# Patient Record
Sex: Female | Born: 1960
Health system: Southern US, Community
[De-identification: ages and names within clinical notes are randomized; demographics above are authoritative.]

## PROBLEM LIST (undated history)

## (undated) DIAGNOSIS — T7840XA Allergy, unspecified, initial encounter: Secondary | ICD-10-CM

## (undated) DIAGNOSIS — K219 Gastro-esophageal reflux disease without esophagitis: Secondary | ICD-10-CM

## (undated) DIAGNOSIS — I10 Essential (primary) hypertension: Secondary | ICD-10-CM

## (undated) DIAGNOSIS — F329 Major depressive disorder, single episode, unspecified: Secondary | ICD-10-CM

## (undated) DIAGNOSIS — R011 Cardiac murmur, unspecified: Secondary | ICD-10-CM

## (undated) DIAGNOSIS — I499 Cardiac arrhythmia, unspecified: Secondary | ICD-10-CM

## (undated) DIAGNOSIS — R9389 Abnormal findings on diagnostic imaging of other specified body structures: Secondary | ICD-10-CM

## (undated) DIAGNOSIS — F32A Depression, unspecified: Secondary | ICD-10-CM

## (undated) DIAGNOSIS — R079 Chest pain, unspecified: Secondary | ICD-10-CM

## (undated) DIAGNOSIS — E079 Disorder of thyroid, unspecified: Secondary | ICD-10-CM

## (undated) DIAGNOSIS — F410 Panic disorder [episodic paroxysmal anxiety] without agoraphobia: Secondary | ICD-10-CM

## (undated) DIAGNOSIS — F419 Anxiety disorder, unspecified: Secondary | ICD-10-CM

## (undated) DIAGNOSIS — Z720 Tobacco use: Secondary | ICD-10-CM

## (undated) DIAGNOSIS — E785 Hyperlipidemia, unspecified: Secondary | ICD-10-CM

## (undated) HISTORY — DX: Allergy, unspecified, initial encounter: T78.40XA

## (undated) HISTORY — PX: CARPAL TUNNEL RELEASE: SHX101

## (undated) HISTORY — DX: Gastro-esophageal reflux disease without esophagitis: K21.9

## (undated) HISTORY — DX: Depression, unspecified: F32.A

## (undated) HISTORY — DX: Essential (primary) hypertension: I10

## (undated) HISTORY — DX: Cardiac arrhythmia, unspecified: I49.9

## (undated) HISTORY — DX: Disorder of thyroid, unspecified: E07.9

## (undated) HISTORY — DX: Cardiac murmur, unspecified: R01.1

## (undated) HISTORY — DX: Major depressive disorder, single episode, unspecified: F32.9

---
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
## 1992-10-26 HISTORY — PX: HYSTEROSCOPY: SHX211

## 1993-10-26 HISTORY — PX: ABDOMINAL HYSTERECTOMY: SHX81

## 2007-10-28 LAB — HM PAP SMEAR: HM Pap smear: NEGATIVE

## 2008-04-01 ENCOUNTER — Emergency Department (HOSPITAL_COMMUNITY): Admission: EM | Admit: 2008-04-01 | Discharge: 2008-04-01 | Payer: Self-pay | Admitting: Emergency Medicine

## 2008-05-03 ENCOUNTER — Ambulatory Visit (HOSPITAL_COMMUNITY): Admission: RE | Admit: 2008-05-03 | Discharge: 2008-05-03 | Payer: Self-pay | Admitting: Obstetrics & Gynecology

## 2008-05-14 ENCOUNTER — Ambulatory Visit (HOSPITAL_COMMUNITY): Admission: RE | Admit: 2008-05-14 | Discharge: 2008-05-14 | Payer: Self-pay | Admitting: Obstetrics & Gynecology

## 2008-10-27 LAB — HM MAMMOGRAPHY: HM Mammogram: NEGATIVE

## 2009-06-13 ENCOUNTER — Ambulatory Visit (HOSPITAL_COMMUNITY): Admission: RE | Admit: 2009-06-13 | Discharge: 2009-06-13 | Payer: Self-pay | Admitting: Family Medicine

## 2009-06-24 ENCOUNTER — Encounter: Admission: RE | Admit: 2009-06-24 | Discharge: 2009-06-24 | Payer: Self-pay | Admitting: Family Medicine

## 2010-01-24 ENCOUNTER — Encounter: Admission: RE | Admit: 2010-01-24 | Discharge: 2010-01-24 | Payer: Self-pay | Admitting: Endocrinology

## 2010-11-17 ENCOUNTER — Encounter: Payer: Self-pay | Admitting: Family Medicine

## 2011-07-28 ENCOUNTER — Ambulatory Visit (INDEPENDENT_AMBULATORY_CARE_PROVIDER_SITE_OTHER): Payer: PRIVATE HEALTH INSURANCE | Admitting: Family Medicine

## 2011-07-28 ENCOUNTER — Encounter: Payer: Self-pay | Admitting: Family Medicine

## 2011-07-28 VITALS — BP 120/80 | HR 72 | Temp 97.9°F | Resp 12 | Ht 63.25 in | Wt 150.0 lb

## 2011-07-28 DIAGNOSIS — E039 Hypothyroidism, unspecified: Secondary | ICD-10-CM

## 2011-07-28 DIAGNOSIS — E785 Hyperlipidemia, unspecified: Secondary | ICD-10-CM | POA: Insufficient documentation

## 2011-07-28 DIAGNOSIS — K219 Gastro-esophageal reflux disease without esophagitis: Secondary | ICD-10-CM

## 2011-07-28 DIAGNOSIS — F17200 Nicotine dependence, unspecified, uncomplicated: Secondary | ICD-10-CM

## 2011-07-28 LAB — TSH: TSH: 0.82 u[IU]/mL (ref 0.35–5.50)

## 2011-07-28 MED ORDER — LEVOTHYROXINE SODIUM 112 MCG PO TABS: 112.0000 ug | ORAL_TABLET | Freq: Every day | ORAL | Status: DC

## 2011-07-28 NOTE — Progress Notes (Signed)
  Subjective:    Patient ID: Tara Cameron, female    DOB: 1961-02-03, 50 y.o.   MRN: [id number]  HPI New patient to establish care. Patient has history of GERD, past history depression, reported history of heart murmur, hyperlipidemia, hypothyroidism and ongoing nicotine use. She's had previous surgeries as indicated elsewhere. Also reported history of endometriosis. She takes thyroid replacement and omeprazole 20 mg daily. No labs in approximately one year. Allergy to sulfa, erythromycin, and Zithromax.  Mother with breast cancer history. Father had lung cancer.  Patient is recently remarried. This is second marriage. Smokes about half-pack cigarettes per day. Occasional alcohol use.  Recently had health screening at work. Blood pressure 178/78. No confirmed history of hypertension. No headaches or dizziness. She's not had complete physical in some time. Had remote flexible sigmoidoscopy about 10 years ago. Tetanus about 5 years ago.   Review of Systems  Constitutional: Negative for fever, chills, activity change, appetite change, fatigue and unexpected weight change.  HENT: Negative for ear pain.   Respiratory: Negative for cough and shortness of breath.   Cardiovascular: Negative for chest pain, palpitations and leg swelling.  Gastrointestinal: Negative for nausea, vomiting, abdominal pain, diarrhea and blood in stool.  Genitourinary: Negative for dysuria.  Skin: Negative for rash.  Neurological: Negative for dizziness, weakness and headaches.  Hematological: Negative for adenopathy. Does not bruise/bleed easily.  Psychiatric/Behavioral: Negative for dysphoric mood and agitation.       Objective:   Physical Exam  Constitutional: She is oriented to person, place, and time. She appears well-developed and well-nourished.  HENT:  Right Ear: External ear normal.  Left Ear: External ear normal.  Mouth/Throat: Oropharynx is clear and moist.  Neck: Neck supple. No thyromegaly  present.  Cardiovascular: Normal rate and regular rhythm.   No murmur heard. Pulmonary/Chest: Effort normal and breath sounds normal. No respiratory distress. She has no wheezes. She has no rales.  Musculoskeletal: She exhibits no edema.  Lymphadenopathy:    She has no cervical adenopathy.  Neurological: She is alert and oriented to person, place, and time.  Skin: No rash noted.  Psychiatric: She has a normal mood and affect. Her behavior is normal.          Assessment & Plan:  #1 hypothyroidism. Recheck TSH #2 history of GERD. Relatively stable. Continue omeprazole  #3 nicotine use. Discussed smoking cessation strategies. Patient reluctant to consider Chantix with prior history of depression  #4 past history depression currently stable off medication  #5 dyslipidemia. Recent labs reviewed. Dietary factors discussed. Consider complete physical later this year repeat lipids then.

## 2011-07-28 NOTE — Patient Instructions (Signed)
Consider complete physical later this year.

## 2011-07-29 NOTE — Progress Notes (Signed)
Quick Note:  Pt informed on home VM ______ 

## 2011-09-04 ENCOUNTER — Encounter: Payer: Self-pay | Admitting: Family Medicine

## 2011-09-04 ENCOUNTER — Ambulatory Visit (INDEPENDENT_AMBULATORY_CARE_PROVIDER_SITE_OTHER): Payer: PRIVATE HEALTH INSURANCE | Admitting: Family Medicine

## 2011-09-04 VITALS — BP 130/96 | Temp 98.3°F | Wt 153.0 lb

## 2011-09-04 DIAGNOSIS — F411 Generalized anxiety disorder: Secondary | ICD-10-CM

## 2011-09-04 DIAGNOSIS — F419 Anxiety disorder, unspecified: Secondary | ICD-10-CM

## 2011-09-04 DIAGNOSIS — G4700 Insomnia, unspecified: Secondary | ICD-10-CM

## 2011-09-04 MED ORDER — CLONAZEPAM 0.5 MG PO TABS: 0.5000 mg | ORAL_TABLET | Freq: Two times a day (BID) | ORAL | Status: DC | PRN
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
## 2011-09-10 ENCOUNTER — Telehealth: Payer: Self-pay | Admitting: Family Medicine

## 2011-09-10 NOTE — Telephone Encounter (Signed)
Pt called and said that she faxed over some FMLA paperwork that Dr Caryl Never needs to complete and sign, because he had written pt out of work for 2 wks re: ?ptsd. Pt said to be sure to fax form back to secured fax # [phone] attn [facility] @ [place]. Pt is aware that Dr Caryl Never is out of the office this wk.

## 2011-09-15 ENCOUNTER — Ambulatory Visit (INDEPENDENT_AMBULATORY_CARE_PROVIDER_SITE_OTHER): Payer: PRIVATE HEALTH INSURANCE | Admitting: Licensed Clinical Social Worker

## 2011-09-15 DIAGNOSIS — F4310 Post-traumatic stress disorder, unspecified: Secondary | ICD-10-CM

## 2011-09-15 DIAGNOSIS — F411 Generalized anxiety disorder: Secondary | ICD-10-CM

## 2011-09-15 DIAGNOSIS — F331 Major depressive disorder, recurrent, moderate: Secondary | ICD-10-CM

## 2011-09-15 NOTE — Telephone Encounter (Signed)
Pt would like nancy to return her call concerning FMLA . Pt has ov with MD tomorrow

## 2011-09-16 ENCOUNTER — Telehealth: Payer: Self-pay | Admitting: Family Medicine

## 2011-09-16 ENCOUNTER — Ambulatory Visit (INDEPENDENT_AMBULATORY_CARE_PROVIDER_SITE_OTHER): Payer: PRIVATE HEALTH INSURANCE | Admitting: Family Medicine

## 2011-09-16 ENCOUNTER — Encounter: Payer: Self-pay | Admitting: Family Medicine

## 2011-09-16 VITALS — BP 102/80 | Temp 98.1°F | Wt 154.0 lb

## 2011-09-16 DIAGNOSIS — F411 Generalized anxiety disorder: Secondary | ICD-10-CM

## 2011-09-16 DIAGNOSIS — F419 Anxiety disorder, unspecified: Secondary | ICD-10-CM

## 2011-09-16 MED ORDER — CLONAZEPAM 1 MG PO TABS: 1.0000 mg | ORAL_TABLET | Freq: Two times a day (BID) | ORAL | Status: DC | PRN

## 2011-09-16 NOTE — Telephone Encounter (Signed)
Pt was told to call and let Burchette know that Dr Loralie Champagne appt is 10-21-2011. Should she follow up with Dr Caryl Never prior? Thanks.

## 2011-09-16 NOTE — Telephone Encounter (Signed)
No need to unless she feels symptoms worsen between now and then.

## 2011-09-16 NOTE — Telephone Encounter (Signed)
Pt seen today.  Papers to be completed today.
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
## 2011-09-18 ENCOUNTER — Ambulatory Visit: Payer: PRIVATE HEALTH INSURANCE | Admitting: Family Medicine

## 2011-09-18 NOTE — Telephone Encounter (Signed)
FMLA papers filled out to reflect time out until f/u with psychiatry.

## 2011-09-18 NOTE — Telephone Encounter (Signed)
Pt is aware.  

## 2011-09-18 NOTE — Telephone Encounter (Signed)
Left a message for pt to return call 

## 2011-09-18 NOTE — Telephone Encounter (Signed)
FMLA papers have been faxed.  

## 2011-09-18 NOTE — Telephone Encounter (Signed)
Pt states she is suppose to be written out of work until the middle of December. December 26,2012 was the earliest appointment pt could get with Dr Nolen Mu.  Pt states she does not feel comfortable going back to work with being on a mood stabilizer. Pls advise.

## 2011-09-23 ENCOUNTER — Ambulatory Visit: Payer: PRIVATE HEALTH INSURANCE | Admitting: Family Medicine

## 2011-09-24 ENCOUNTER — Ambulatory Visit (INDEPENDENT_AMBULATORY_CARE_PROVIDER_SITE_OTHER): Payer: PRIVATE HEALTH INSURANCE | Admitting: Licensed Clinical Social Worker

## 2011-09-24 DIAGNOSIS — F331 Major depressive disorder, recurrent, moderate: Secondary | ICD-10-CM

## 2011-09-24 DIAGNOSIS — F411 Generalized anxiety disorder: Secondary | ICD-10-CM

## 2011-09-24 DIAGNOSIS — F4310 Post-traumatic stress disorder, unspecified: Secondary | ICD-10-CM

## 2011-09-29 ENCOUNTER — Telehealth: Payer: Self-pay | Admitting: Family Medicine

## 2011-09-29 NOTE — Telephone Encounter (Signed)
Pt called and wanted to let you know that Judithe Modest has recommended that pt see Dr. Lanier Clam psychiatrist,  and pt is sch at 10/05/11 at 11am to see nurse practitioner, Valinda Hoar.

## 2011-10-08 ENCOUNTER — Ambulatory Visit (INDEPENDENT_AMBULATORY_CARE_PROVIDER_SITE_OTHER): Payer: PRIVATE HEALTH INSURANCE | Admitting: Licensed Clinical Social Worker

## 2011-10-08 DIAGNOSIS — F4310 Post-traumatic stress disorder, unspecified: Secondary | ICD-10-CM

## 2011-10-08 DIAGNOSIS — F331 Major depressive disorder, recurrent, moderate: Secondary | ICD-10-CM

## 2011-10-08 DIAGNOSIS — F411 Generalized anxiety disorder: Secondary | ICD-10-CM

## 2011-10-08 NOTE — Telephone Encounter (Signed)
FYI

## 2011-10-14 ENCOUNTER — Ambulatory Visit: Payer: PRIVATE HEALTH INSURANCE | Admitting: Licensed Clinical Social Worker

## 2011-10-16 ENCOUNTER — Ambulatory Visit: Payer: PRIVATE HEALTH INSURANCE | Admitting: Licensed Clinical Social Worker

## 2012-04-13 ENCOUNTER — Other Ambulatory Visit (INDEPENDENT_AMBULATORY_CARE_PROVIDER_SITE_OTHER): Payer: PRIVATE HEALTH INSURANCE

## 2012-04-13 DIAGNOSIS — Z0000 Encounter for general adult medical examination without abnormal findings: Secondary | ICD-10-CM

## 2012-04-13 LAB — HEPATIC FUNCTION PANEL
ALT: 17 U/L (ref 0–35)
AST: 23 U/L (ref 0–37)
Albumin: 4.3 g/dL (ref 3.5–5.2)
Alkaline Phosphatase: 73 U/L (ref 39–117)
Bilirubin, Direct: 0 mg/dL (ref 0.0–0.3)
Total Bilirubin: 0.5 mg/dL (ref 0.3–1.2)
Total Protein: 7.6 g/dL (ref 6.0–8.3)

## 2012-04-13 LAB — CBC WITH DIFFERENTIAL/PLATELET
Basophils Absolute: 0 10*3/uL (ref 0.0–0.1)
Basophils Relative: 0.3 % (ref 0.0–3.0)
Eosinophils Relative: 3.1 % (ref 0.0–5.0)
Lymphocytes Relative: 31.5 % (ref 12.0–46.0)
MCHC: 33.4 g/dL (ref 30.0–36.0)
MCV: 96.7 fl (ref 78.0–100.0)
Neutro Abs: 4 10*3/uL (ref 1.4–7.7)
Neutrophils Relative %: 56.7 % (ref 43.0–77.0)

## 2012-04-13 LAB — TSH: TSH: 1.16 u[IU]/mL (ref 0.35–5.50)

## 2012-04-13 LAB — LIPID PANEL
Cholesterol: 252 mg/dL — ABNORMAL HIGH (ref 0–200)
HDL: 57.3 mg/dL
Total CHOL/HDL Ratio: 4
Triglycerides: 109 mg/dL (ref 0.0–149.0)
VLDL: 21.8 mg/dL (ref 0.0–40.0)

## 2012-04-13 LAB — BASIC METABOLIC PANEL WITH GFR
BUN: 13 mg/dL (ref 6–23)
CO2: 28 meq/L (ref 19–32)
Calcium: 9 mg/dL (ref 8.4–10.5)
Chloride: 106 meq/L (ref 96–112)
Creatinine, Ser: 0.7 mg/dL (ref 0.4–1.2)
GFR: 90.92 mL/min
Glucose, Bld: 83 mg/dL (ref 70–99)
Potassium: 3.8 meq/L (ref 3.5–5.1)
Sodium: 140 meq/L (ref 135–145)

## 2012-04-13 LAB — POCT URINALYSIS DIPSTICK
Bilirubin, UA: NEGATIVE
Blood, UA: NEGATIVE
Glucose, UA: NEGATIVE
Ketones, UA: NEGATIVE
Leukocytes, UA: NEGATIVE
Nitrite, UA: NEGATIVE
Protein, UA: NEGATIVE
Spec Grav, UA: 1.01
Urobilinogen, UA: 0.2
pH, UA: 6.5

## 2012-04-18 ENCOUNTER — Ambulatory Visit (INDEPENDENT_AMBULATORY_CARE_PROVIDER_SITE_OTHER): Payer: PRIVATE HEALTH INSURANCE | Admitting: Family Medicine

## 2012-04-18 ENCOUNTER — Encounter: Payer: Self-pay | Admitting: Family Medicine

## 2012-04-18 VITALS — BP 122/80 | HR 72 | Temp 98.1°F | Resp 12 | Ht 64.0 in | Wt 156.0 lb

## 2012-04-18 DIAGNOSIS — B351 Tinea unguium: Secondary | ICD-10-CM

## 2012-04-18 DIAGNOSIS — Z0000 Encounter for general adult medical examination without abnormal findings: Secondary | ICD-10-CM

## 2012-04-18 DIAGNOSIS — Z23 Encounter for immunization: Secondary | ICD-10-CM

## 2012-04-18 DIAGNOSIS — Z8742 Personal history of other diseases of the female genital tract: Secondary | ICD-10-CM

## 2012-04-18 DIAGNOSIS — E785 Hyperlipidemia, unspecified: Secondary | ICD-10-CM

## 2012-04-18 MED ORDER — LEVOTHYROXINE SODIUM 112 MCG PO TABS: 112.0000 ug | ORAL_TABLET | Freq: Every day | ORAL | Status: DC

## 2012-04-18 MED ORDER — TERBINAFINE HCL 250 MG PO TABS: 250.0000 mg | ORAL_TABLET | Freq: Every day | ORAL | Status: DC

## 2012-04-18 NOTE — Patient Instructions (Addendum)
Cholesterol Control Diet Cholesterol levels in your body are determined significantly by your diet. Cholesterol levels may also be related to heart disease. The following material helps to explain this relationship and discusses what you can do to help keep your heart healthy. Not all cholesterol is bad. Low-density lipoprotein (LDL) cholesterol is the "bad" cholesterol. It may cause fatty deposits to build up inside your arteries. High-density lipoprotein (HDL) cholesterol is "good." It helps to remove the "bad" LDL cholesterol from your blood. Cholesterol is a very important risk factor for heart disease. Other risk factors are high blood pressure, smoking, stress, heredity, and weight. The heart muscle gets its supply of blood through the coronary arteries. If your LDL cholesterol is high and your HDL cholesterol is low, you are at risk for having fatty deposits build up in your coronary arteries. This leaves less room through which blood can flow. Without sufficient blood and oxygen, the heart muscle cannot function properly and you may feel chest pains (angina pectoris). When a coronary artery closes up entirely, a part of the heart muscle may die, causing a heart attack (myocardial infarction). CHECKING CHOLESTEROL When your caregiver sends your blood to a lab to be analyzed for cholesterol, a complete lipid (fat) profile may be done. With this test, the total amount of cholesterol and levels of LDL and HDL are determined. Triglycerides are a type of fat that circulates in the blood and can also be used to determine heart disease risk. The list below describes what the numbers should be: Test: Total Cholesterol. Less than 200 mg/dl.  Test: LDL "bad cholesterol." Less than 100 mg/dl.  Less than 70 mg/dl if you are at very high risk of a heart attack or sudden cardiac death.  Test: HDL "good cholesterol." Greater than 50 mg/dl for women.  Greater than 40 mg/dl for men.  Test: Triglycerides. Less  than 150 mg/dl.  CONTROLLING CHOLESTEROL WITH DIET Although exercise and lifestyle factors are important, your diet is key. That is because certain foods are known to raise cholesterol and others to lower it. The goal is to balance foods for their effect on cholesterol and more importantly, to replace saturated and trans fat with other types of fat, such as monounsaturated fat, polyunsaturated fat, and omega-3 fatty acids. On average, a person should consume no more than 15 to 17 g of saturated fat daily. Saturated and trans fats are considered "bad" fats, and they will raise LDL cholesterol. Saturated fats are primarily found in animal products such as meats, butter, and cream. However, that does not mean you need to sacrifice all your favorite foods. Today, there are good tasting, low-fat, low-cholesterol substitutes for most of the things you like to eat. Choose low-fat or nonfat alternatives. Choose round or loin cuts of red meat, since these types of cuts are lowest in fat and cholesterol. Chicken (without the skin), fish, veal, and ground [place] breast are excellent choices. Eliminate fatty meats, such as hot dogs and salami. Even shellfish have little or no saturated fat. Have a 3 oz (85 g) portion when you eat lean meat, poultry, or fish. Trans fats are also called "partially hydrogenated oils." They are oils that have been scientifically manipulated so that they are solid at room temperature resulting in a longer shelf life and improved taste and texture of foods in which they are added. Trans fats are found in stick margarine, some tub margarines, cookies, crackers, and baked goods.  When baking and cooking, oils are an  excellent substitute for butter. The monounsaturated oils are especially beneficial since it is believed they lower LDL and raise HDL. The oils you should avoid entirely are saturated tropical oils, such as coconut and palm.  Remember to eat liberally from food groups that are  naturally free of saturated and trans fat, including fish, fruit, vegetables, beans, grains (barley, rice, couscous, bulgur wheat), and pasta (without cream sauces).  IDENTIFYING FOODS THAT LOWER CHOLESTEROL  Soluble fiber may lower your cholesterol. This type of fiber is found in fruits such as apples, vegetables such as broccoli, potatoes, and carrots, legumes such as beans, peas, and lentils, and grains such as barley. Foods fortified with plant sterols (phytosterol) may also lower cholesterol. You should eat at least 2 g per day of these foods for a cholesterol lowering effect.  Read package labels to identify low-saturated fats, trans fats free, and low-fat foods at the supermarket. Select cheeses that have only 2 to 3 g saturated fat per ounce. Use a heart-healthy tub margarine that is free of trans fats or partially hydrogenated oil. When buying baked goods (cookies, crackers), avoid partially hydrogenated oils. Breads and muffins should be made from whole grains (whole-wheat or whole oat flour, instead of "flour" or "enriched flour"). Buy non-creamy canned soups with reduced salt and no added fats.  FOOD PREPARATION TECHNIQUES  Never deep-fry. If you must fry, either stir-fry, which uses very little fat, or use non-stick cooking sprays. When possible, broil, bake, or roast meats, and steam vegetables. Instead of dressing vegetables with butter or margarine, use lemon and herbs, applesauce and cinnamon (for squash and sweet potatoes), nonfat yogurt, salsa, and low-fat dressings for salads.  LOW-SATURATED FAT / LOW-FAT FOOD SUBSTITUTES Meats / Saturated Fat (g) Avoid: Steak, marbled (3 oz/85 g) / 11 g  Choose: Steak, lean (3 oz/85 g) / 4 g  Avoid: Hamburger (3 oz/85 g) / 7 g  Choose: Hamburger, lean (3 oz/85 g) / 5 g  Avoid: Ham (3 oz/85 g) / 6 g  Choose: Ham, lean cut (3 oz/85 g) / 2.4 g  Avoid: Chicken, with skin, dark meat (3 oz/85 g) / 4 g  Choose: Chicken, skin removed, dark meat (3 oz/85  g) / 2 g  Avoid: Chicken, with skin, light meat (3 oz/85 g) / 2.5 g  Choose: Chicken, skin removed, light meat (3 oz/85 g) / 1 g  Dairy / Saturated Fat (g) Avoid: Whole milk (1 cup) / 5 g  Choose: Low-fat milk, 2% (1 cup) / 3 g  Choose: Low-fat milk, 1% (1 cup) / 1.5 g  Choose: Skim milk (1 cup) / 0.3 g  Avoid: Hard cheese (1 oz/28 g) / 6 g  Choose: Skim milk cheese (1 oz/28 g) / 2 to 3 g  Avoid: Cottage cheese, 4% fat (1 cup) / 6.5 g  Choose: Low-fat cottage cheese, 1% fat (1 cup) / 1.5 g  Avoid: Ice cream (1 cup) / 9 g  Choose: Sherbet (1 cup) / 2.5 g  Choose: Nonfat frozen yogurt (1 cup) / 0.3 g  Choose: Frozen fruit bar / trace  Avoid: Whipped cream (1 tbs) / 3.5 g  Choose: Nondairy whipped topping (1 tbs) / 1 g  Condiments / Saturated Fat (g) Avoid: Mayonnaise (1 tbs) / 2 g  Choose: Low-fat mayonnaise (1 tbs) / 1 g  Avoid: Butter (1 tbs) / 7 g  Choose: Extra light margarine (1 tbs) / 1 g  Avoid: Coconut oil (1 tbs) / 11.8 g  Choose: Olive oil (1 tbs) / 1.8 g  Choose: Corn oil (1 tbs) / 1.7 g  Choose: Safflower oil (1 tbs) / 1.2 g  Choose: Sunflower oil (1 tbs) / 1.4 g  Choose: Soybean oil (1 tbs) / 2.4 g  Choose: Canola oil (1 tbs) / 1 g  Document Released: 10/12/2005 Document Revised: 10/01/2011 Document Reviewed: 04/02/2011 [facility] Patient Information 2012 [place], [place].  Smoking Cessation This document explains the best ways for you to quit smoking and new treatments to help. It lists new medicines that can double or triple your chances of quitting and quitting for good. It also considers ways to avoid relapses and concerns you may have about quitting, including weight gain. NICOTINE: A POWERFUL ADDICTION If you have tried to quit smoking, you know how hard it can be. It is hard because nicotine is a very addictive drug. For some people, it can be as addictive as heroin or cocaine. Usually, people make 2 or 3 tries, or more, before finally being able to quit. Each time  you try to quit, you can learn about what helps and what hurts. Quitting takes hard work and a lot of effort, but you can quit smoking. QUITTING SMOKING IS ONE OF THE MOST IMPORTANT THINGS YOU WILL EVER DO.  You will live longer, feel better, and live better.   The impact on your body of quitting smoking is felt almost immediately:   Within 20 minutes, blood pressure decreases. Pulse returns to its normal level.   After 8 hours, carbon monoxide levels in the blood return to normal. Oxygen level increases.   After 24 hours, chance of heart attack starts to decrease. Breath, hair, and body stop smelling like smoke.   After 48 hours, damaged nerve endings begin to recover. Sense of taste and smell improve.   After 72 hours, the body is virtually free of nicotine. Bronchial tubes relax and breathing becomes easier.   After 2 to 12 weeks, lungs can hold more air. Exercise becomes easier and circulation improves.   Quitting will reduce your risk of having a heart attack, stroke, cancer, or lung disease:   After 1 year, the risk of coronary heart disease is cut in half.   After 5 years, the risk of stroke falls to the same as a nonsmoker.   After 10 years, the risk of lung cancer is cut in half and the risk of other cancers decreases significantly.   After 15 years, the risk of coronary heart disease drops, usually to the level of a nonsmoker.   If you are pregnant, quitting smoking will improve your chances of having a healthy baby.   The people you live with, especially your children, will be healthier.   You will have extra money to spend on things other than cigarettes.  FIVE KEYS TO QUITTING Studies have shown that these 5 steps will help you quit smoking and quit for good. You have the best chances of quitting if you use them together: 1. Get ready.  2. Get support and encouragement.  3. Learn new skills and behaviors.  4. Get medicine to reduce your nicotine addiction and use  it correctly.  5. Be prepared for relapse or difficult situations. Be determined to continue trying to quit, even if you do not succeed at first.  1. GET READY  Set a quit date.   Change your environment.   Get rid of ALL cigarettes, ashtrays, matches, and lighters in your home, car, and place  of work.   Do not let people smoke in your home.   Review your past attempts to quit. Think about what worked and what did not.   Once you quit, do not smoke. NOT EVEN A PUFF!  2. GET SUPPORT AND ENCOURAGEMENT Studies have shown that you have a better chance of being successful if you have help. You can get support in many ways.  Tell your family, friends, and coworkers that you are going to quit and need their support. Ask them not to smoke around you.   Talk to your caregivers (doctor, dentist, nurse, pharmacist, psychologist, and/or smoking counselor).   Get individual, group, or telephone counseling and support. The more counseling you have, the better your chances are of quitting. Programs are available at [employer] and health centers. Call your local health department for information about programs in your area.   Spiritual beliefs and practices may help some smokers quit.   Quit meters are Photographer that keep track of quit statistics, such as amount of "quit-time," cigarettes not smoked, and money saved.   Many smokers find one or more of the many self-help books available useful in helping them quit and stay off tobacco.  3. LEARN NEW SKILLS AND BEHAVIORS  Try to distract yourself from urges to smoke. Talk to someone, go for a walk, or occupy your time with a task.   When you first try to quit, change your routine. Take a different route to work. Drink tea instead of coffee. Eat breakfast in a different place.   Do something to reduce your stress. Take a hot bath, exercise, or read a book.   Plan something enjoyable to do every day. Reward  yourself for not smoking.   Explore interactive web-based programs that specialize in helping you quit.  4. GET MEDICINE AND USE IT CORRECTLY Medicines can help you stop smoking and decrease the urge to smoke. Combining medicine with the above behavioral methods and support can quadruple your chances of successfully quitting smoking. The [place] Food and Drug Administration (FDA) has approved 7 medicines to help you quit smoking. These medicines fall into 3 categories.  Nicotine replacement therapy (delivers nicotine to your body without the negative effects and risks of smoking):   Nicotine gum: Available over-the-counter.   Nicotine lozenges: Available over-the-counter.   Nicotine inhaler: Available by prescription.   Nicotine nasal spray: Available by prescription.   Nicotine skin patches (transdermal): Available by prescription and over-the-counter.   Antidepressant medicine (helps people abstain from smoking, but how this works is unknown):   Bupropion sustained-release (SR) tablets: Available by prescription.   Nicotinic receptor partial agonist (simulates the effect of nicotine in your brain):   Varenicline tartrate tablets: Available by prescription.   Ask your caregiver for advice about which medicines to use and how to use them. Carefully read the information on the package.   Everyone who is trying to quit may benefit from using a medicine. If you are pregnant or trying to become pregnant, nursing an infant, you are under age 56, or you smoke fewer than 10 cigarettes per day, talk to your caregiver before taking any nicotine replacement medicines.   You should stop using a nicotine replacement product and call your caregiver if you experience nausea, dizziness, weakness, vomiting, fast or irregular heartbeat, mouth problems with the lozenge or gum, or redness or swelling of the skin around the patch that does not go away.   Do not use  any other product containing nicotine  while using a nicotine replacement product.   Talk to your caregiver before using these products if you have diabetes, heart disease, asthma, stomach ulcers, you had a recent heart attack, you have high blood pressure that is not controlled with medicine, a history of irregular heartbeat, or you have been prescribed medicine to help you quit smoking.  5. BE PREPARED FOR RELAPSE OR DIFFICULT SITUATIONS  Most relapses occur within the first 3 months after quitting. Do not be discouraged if you start smoking again. Remember, most people try several times before they finally quit.   You may have symptoms of withdrawal because your body is used to nicotine. You may crave cigarettes, be irritable, feel very hungry, cough often, get headaches, or have difficulty concentrating.   The withdrawal symptoms are only temporary. They are strongest when you first quit, but they will go away within 10 to 14 days.  Here are some difficult situations to watch for:  Alcohol. Avoid drinking alcohol. Drinking lowers your chances of successfully quitting.   Caffeine. Try to reduce the amount of caffeine you consume. It also lowers your chances of successfully quitting.   Other smokers. Being around smoking can make you want to smoke. Avoid smokers.   Weight gain. Many smokers will gain weight when they quit, usually less than 10 pounds. Eat a healthy diet and stay active. Do not let weight gain distract you from your main goal, quitting smoking. Some medicines that help you quit smoking may also help delay weight gain. You can always lose the weight gained after you quit.   Bad mood or depression. There are a lot of ways to improve your mood other than smoking.  If you are having problems with any of these situations, talk to your caregiver. SPECIAL SITUATIONS AND CONDITIONS Studies suggest that everyone can quit smoking. Your situation or condition can give you a special reason to quit.  Pregnant women/new  mothers: By quitting, you protect your baby's health and your own.   Hospitalized patients: By quitting, you reduce health problems and help healing.   Heart attack patients: By quitting, you reduce your risk of a second heart attack.   Lung, head, and neck cancer patients: By quitting, you reduce your chance of a second cancer.   Parents of children and adolescents: By quitting, you protect your children from illnesses caused by secondhand smoke.  QUESTIONS TO THINK ABOUT Think about the following questions before you try to stop smoking. You may want to talk about your answers with your caregiver.  Why do you want to quit?   If you tried to quit in the past, what helped and what did not?   What will be the most difficult situations for you after you quit? How will you plan to handle them?   Who can help you through the tough times? Your family? Friends? Caregiver?   What pleasures do you get from smoking? What ways can you still get pleasure if you quit?  Here are some questions to ask your caregiver:  How can you help me to be successful at quitting?   What medicine do you think would be best for me and how should I take it?   What should I do if I need more help?   What is smoking withdrawal like? How can I get information on withdrawal?  Quitting takes hard work and a lot of effort, but you can quit smoking. FOR MORE INFORMATION  Smokefree.gov (http://www.davis-sullivan.com/) provides free, accurate, evidence-based information and professional assistance to help support the immediate and long-term needs of people trying to quit smoking. Document Released: 10/06/2001 Document Revised: 10/01/2011 Document Reviewed: 07/29/2009 [facility] Patient Information 2012 [place], [place].

## 2012-04-18 NOTE — Progress Notes (Signed)
Subjective:    Patient ID: Tara Cameron, female    DOB: Dec 28, 1960, 51 y.o.   MRN: [id number]  HPI  Complete physical. Patient history of dyslipidemia, ongoing nicotine use, hypothyroidism, and GERD. Still smoking about half-pack cigarettes per day. She wants to quit. She has reservations about trying Chantix and Wellbutrin.  Hypothyroidism treated with levothyroxine. Compliant with therapy. She's had ongoing symptoms of reflux when she tries to stop omeprazole. No mammogram in 3 years. Previous hysterectomy for benign disease and no indication for Pap smear. Last tetanus greater than 10 years ago. No history of Pneumovax.  Past Medical History  Diagnosis Date  . Depression   . GERD (gastroesophageal reflux disease)   . Allergy   . Arrhythmia   . Cardiac murmur   . Hypertension   . Chronic kidney disease   . Thyroid disease    Past Surgical History  Procedure Date  . Tonsillectomy 1969  . Abdominal hysterectomy 1995  . Hysteroscopy 1994  . Carpal tunnel release     1988, 1994    reports that she has been smoking Cigarettes.  She has a 2.5 pack-year smoking history. She does not have any smokeless tobacco history on file. Her alcohol and drug histories not on file. family history includes Cancer in her father and mother. Allergies  Allergen Reactions  . Erythromycin     hives  . Iodine     hives  . Sulfa Antibiotics     hives  . Zithromax (Azithromycin Dihydrate)     hives      Review of Systems  Constitutional: Negative for fever, activity change, appetite change, fatigue and unexpected weight change.  HENT: Negative for hearing loss, ear pain, sore throat and trouble swallowing.   Eyes: Negative for visual disturbance.  Respiratory: Negative for cough and shortness of breath.   Cardiovascular: Negative for chest pain and palpitations.  Gastrointestinal: Negative for abdominal pain, diarrhea, constipation and blood in stool.  Genitourinary: Negative for  dysuria and hematuria.  Musculoskeletal: Negative for myalgias, back pain and arthralgias.  Skin: Negative for rash.  Neurological: Negative for dizziness, syncope and headaches.  Hematological: Negative for adenopathy.  Psychiatric/Behavioral: Negative for confusion and dysphoric mood.       Objective:   Physical Exam  Constitutional: She is oriented to person, place, and time. She appears well-developed and well-nourished.  HENT:  Head: Normocephalic and atraumatic.  Eyes: EOM are normal. Pupils are equal, round, and reactive to light.  Neck: Normal range of motion. Neck supple. No thyromegaly present.  Cardiovascular: Normal rate, regular rhythm and normal heart sounds.   No murmur heard. Pulmonary/Chest: Breath sounds normal. No respiratory distress. She has no wheezes. She has no rales.  Abdominal: Soft. Bowel sounds are normal. She exhibits no distension and no mass. There is no tenderness. There is no rebound and no guarding.  Musculoskeletal: Normal range of motion. She exhibits no edema.  Lymphadenopathy:    She has no cervical adenopathy.  Neurological: She is alert and oriented to person, place, and time. She displays normal reflexes. No cranial nerve deficit.  Skin: No rash noted.  Psychiatric: She has a normal mood and affect. Her behavior is normal. Judgment and thought content normal.          Assessment & Plan:  Complete physical. Hemoccults given. Patient is not sure she wishes to schedule colonoscopy yet. Pneumovax given. Tetanus given. Refill levothyroxin for one year. Set up repeat mammogram. Smoking cessation discussed. Repeat lipid 6  months after reduction in saturated fats.

## 2012-04-29 ENCOUNTER — Ambulatory Visit: Payer: PRIVATE HEALTH INSURANCE | Admitting: Family Medicine

## 2012-05-04 ENCOUNTER — Emergency Department (HOSPITAL_COMMUNITY): Payer: 59

## 2012-05-04 ENCOUNTER — Encounter (HOSPITAL_COMMUNITY): Payer: Self-pay | Admitting: *Deleted

## 2012-05-04 ENCOUNTER — Observation Stay (HOSPITAL_COMMUNITY)
Admission: EM | Admit: 2012-05-04 | Discharge: 2012-05-05 | Disposition: A | Discharge status: Home / Self Care | Payer: 59 | Attending: Internal Medicine | Admitting: Internal Medicine

## 2012-05-04 ENCOUNTER — Observation Stay (HOSPITAL_COMMUNITY): Payer: 59

## 2012-05-04 ENCOUNTER — Telehealth: Payer: Self-pay | Admitting: Family Medicine

## 2012-05-04 DIAGNOSIS — R079 Chest pain, unspecified: Secondary | ICD-10-CM

## 2012-05-04 DIAGNOSIS — I200 Unstable angina: Secondary | ICD-10-CM

## 2012-05-04 DIAGNOSIS — E039 Hypothyroidism, unspecified: Secondary | ICD-10-CM | POA: Diagnosis present

## 2012-05-04 DIAGNOSIS — F329 Major depressive disorder, single episode, unspecified: Secondary | ICD-10-CM | POA: Insufficient documentation

## 2012-05-04 DIAGNOSIS — I10 Essential (primary) hypertension: Secondary | ICD-10-CM

## 2012-05-04 DIAGNOSIS — F17200 Nicotine dependence, unspecified, uncomplicated: Secondary | ICD-10-CM | POA: Insufficient documentation

## 2012-05-04 DIAGNOSIS — F419 Anxiety disorder, unspecified: Secondary | ICD-10-CM

## 2012-05-04 DIAGNOSIS — R0789 Other chest pain: Principal | ICD-10-CM | POA: Insufficient documentation

## 2012-05-04 DIAGNOSIS — Z79899 Other long term (current) drug therapy: Secondary | ICD-10-CM | POA: Insufficient documentation

## 2012-05-04 DIAGNOSIS — E876 Hypokalemia: Secondary | ICD-10-CM

## 2012-05-04 DIAGNOSIS — F410 Panic disorder [episodic paroxysmal anxiety] without agoraphobia: Secondary | ICD-10-CM | POA: Insufficient documentation

## 2012-05-04 DIAGNOSIS — Z720 Tobacco use: Secondary | ICD-10-CM

## 2012-05-04 DIAGNOSIS — K219 Gastro-esophageal reflux disease without esophagitis: Secondary | ICD-10-CM | POA: Diagnosis present

## 2012-05-04 DIAGNOSIS — E785 Hyperlipidemia, unspecified: Secondary | ICD-10-CM | POA: Diagnosis present

## 2012-05-04 DIAGNOSIS — R911 Solitary pulmonary nodule: Secondary | ICD-10-CM | POA: Insufficient documentation

## 2012-05-04 DIAGNOSIS — F3289 Other specified depressive episodes: Secondary | ICD-10-CM | POA: Insufficient documentation

## 2012-05-04 HISTORY — DX: Anxiety disorder, unspecified: F41.9

## 2012-05-04 HISTORY — DX: Hyperlipidemia, unspecified: E78.5

## 2012-05-04 HISTORY — DX: Abnormal findings on diagnostic imaging of other specified body structures: R93.89

## 2012-05-04 HISTORY — DX: Chest pain, unspecified: R07.9

## 2012-05-04 HISTORY — DX: Tobacco use: Z72.0

## 2012-05-04 HISTORY — DX: Panic disorder (episodic paroxysmal anxiety): F41.0

## 2012-05-04 LAB — BASIC METABOLIC PANEL
Calcium: 9.5 mg/dL (ref 8.4–10.5)
Creatinine, Ser: 0.73 mg/dL (ref 0.50–1.10)
GFR calc non Af Amer: >90 mL/min (ref >90)
Glucose, Bld: 94 mg/dL (ref 70–99)
Sodium: 135 mEq/L (ref 135–145)

## 2012-05-04 LAB — CARDIAC PANEL(CRET KIN+CKTOT+MB+TROPI)
CK, MB: 1.9 ng/mL (ref 0.3–4.0)
Relative Index: INVALID (ref 0.0–2.5)
Total CK: 67 U/L (ref 7–177)
Total CK: 73 U/L (ref 7–177)
Troponin I: <0.3 ng/mL (ref <0.30)

## 2012-05-04 LAB — POCT I-STAT TROPONIN I

## 2012-05-04 LAB — CBC
Hemoglobin: 13.9 g/dL (ref 12.0–15.0)
MCH: 32.5 pg (ref 26.0–34.0)
MCHC: 36 g/dL (ref 30.0–36.0)
MCV: 90.2 fL (ref 78.0–100.0)

## 2012-05-04 MED ORDER — ALPRAZOLAM 0.25 MG PO TABS: 0.2500 mg | ORAL_TABLET | Freq: Two times a day (BID) | ORAL | Status: DC | PRN

## 2012-05-04 MED ORDER — ATORVASTATIN CALCIUM 20 MG PO TABS
20.0000 mg | ORAL_TABLET | Freq: Every day | ORAL | Status: DC
  Administered 2012-05-04: 20 mg via ORAL
  Filled 2012-05-04 (×2): qty 1

## 2012-05-04 MED ORDER — ASPIRIN EC 81 MG PO TBEC
81.0000 mg | DELAYED_RELEASE_TABLET | Freq: Every day | ORAL | Status: DC
  Administered 2012-05-05: 81 mg via ORAL
  Filled 2012-05-04: qty 1

## 2012-05-04 MED ORDER — ASPIRIN 325 MG PO TABS
325.0000 mg | ORAL_TABLET | ORAL | Status: AC
  Administered 2012-05-04: 325 mg via ORAL
  Filled 2012-05-04: qty 1

## 2012-05-04 MED ORDER — MORPHINE SULFATE 2 MG/ML IJ SOLN
2.0000 mg | INTRAMUSCULAR | Status: DC | PRN
  Administered 2012-05-04 – 2012-05-05 (×3): 2 mg via INTRAVENOUS
  Filled 2012-05-04 (×3): qty 1

## 2012-05-04 MED ORDER — LEVOTHYROXINE SODIUM 112 MCG PO TABS: 112.0000 ug | ORAL_TABLET | Freq: Every day | ORAL | Status: DC

## 2012-05-04 MED ORDER — SODIUM CHLORIDE 0.9 % IV SOLN
INTRAVENOUS | Status: DC
  Administered 2012-05-04: 16:00:00 via INTRAVENOUS

## 2012-05-04 MED ORDER — ACETAMINOPHEN 325 MG PO TABS
650.0000 mg | ORAL_TABLET | ORAL | Status: DC | PRN
  Administered 2012-05-04: 650 mg via ORAL
  Filled 2012-05-04: qty 2

## 2012-05-04 MED ORDER — POTASSIUM CHLORIDE CRYS ER 20 MEQ PO TBCR
40.0000 meq | EXTENDED_RELEASE_TABLET | Freq: Once | ORAL | Status: AC
  Administered 2012-05-04: 40 meq via ORAL
  Filled 2012-05-04: qty 2

## 2012-05-04 MED ORDER — PANTOPRAZOLE SODIUM 40 MG PO TBEC
40.0000 mg | DELAYED_RELEASE_TABLET | Freq: Every day | ORAL | Status: DC
  Administered 2012-05-04 – 2012-05-05 (×2): 40 mg via ORAL
  Filled 2012-05-04 (×2): qty 1

## 2012-05-04 MED ORDER — ZOLPIDEM TARTRATE 5 MG PO TABS: 5.0000 mg | ORAL_TABLET | Freq: Every evening | ORAL | Status: DC | PRN

## 2012-05-04 MED ORDER — NITROGLYCERIN 0.4 MG SL SUBL
0.4000 mg | SUBLINGUAL_TABLET | SUBLINGUAL | Status: DC | PRN
  Administered 2012-05-04 (×2): 0.4 mg via SUBLINGUAL
  Filled 2012-05-04: qty 25

## 2012-05-04 MED ORDER — ENOXAPARIN SODIUM 40 MG/0.4ML ~~LOC~~ SOLN
40.0000 mg | SUBCUTANEOUS | Status: DC
  Administered 2012-05-04: 40 mg via SUBCUTANEOUS
  Filled 2012-05-04 (×2): qty 0.4

## 2012-05-04 MED ORDER — POTASSIUM CHLORIDE CRYS ER 20 MEQ PO TBCR: 40.0000 meq | EXTENDED_RELEASE_TABLET | Freq: Once | ORAL | Status: DC

## 2012-05-04 MED ORDER — ONDANSETRON HCL 4 MG/2ML IJ SOLN
4.0000 mg | Freq: Four times a day (QID) | INTRAMUSCULAR | Status: DC | PRN
  Administered 2012-05-04: 4 mg via INTRAVENOUS
  Filled 2012-05-04: qty 2

## 2012-05-04 MED ORDER — MORPHINE SULFATE 2 MG/ML IJ SOLN
2.0000 mg | Freq: Once | INTRAMUSCULAR | Status: AC
  Administered 2012-05-04: 2 mg via INTRAVENOUS
  Filled 2012-05-04: qty 1

## 2012-05-04 MED ORDER — PSYLLIUM 95 % PO PACK
1.0000 | PACK | Freq: Every day | ORAL | Status: DC
  Administered 2012-05-04: 1 via ORAL
  Filled 2012-05-04 (×2): qty 1

## 2012-05-04 MED ORDER — NITROGLYCERIN 0.4 MG SL SUBL: 0.4000 mg | SUBLINGUAL_TABLET | SUBLINGUAL | Status: DC | PRN

## 2012-05-04 MED ORDER — LEVOTHYROXINE SODIUM 112 MCG PO TABS
112.0000 ug | ORAL_TABLET | Freq: Every day | ORAL | Status: DC
  Administered 2012-05-05: 112 ug via ORAL
  Filled 2012-05-04 (×2): qty 1

## 2012-05-04 NOTE — ED Notes (Signed)
Sudden onset of sharp chest pain, no history, radiates to post shoulder on left, pt reports slight dizziness, Shob, nausea with symptoms

## 2012-05-04 NOTE — Progress Notes (Signed)
[facility] ED CM spoke with Gery Pray, PA-C [phone]) about admission status.  PAC to change status (OBS)

## 2012-05-04 NOTE — H&P (Signed)
History and Physical   Patient ID: Tara Cameron MRN: [id number], DOB/AGE: [birth year]   Admit date: 05/04/2012 Date of Consult: 05/04/2012   Primary Physician: Kristian Covey, MD Primary Cardiologist: New to cardiology   Pt. Profile:  Tara Cameron isa 51yo female with PMHx significant for HTN, HL, tobacco abuse (current everyday use, 4-5 cigarettes daily), GERD, hypothyroidism and anxiety who presents to [facility] ED today with c/o chest pain.   HPI:   She works in a [employer]. She bent down to pick up some boxes and when she stood up, she experienced a sharp left-sided chest pain radiating to her left shoulder blade and arm, rated at a 8/10 associated with shortness of breath lasting for about 1 minute. She reports associated nausea. She sat inside, got a drink of water and rested. Her chest pain improved to a 4-5/10, became anxious and began "shaking." She endorses aggravation on inspiration. She denies lightheadedness, palpitations. At baseline, she is fairly active, and denies chest pain, sob, orthopnea, PND, LE edema. She reports adequate hydration when she works. She does note recent increased stress at her work. No active bleeding. No family of blood clots. No fevers, chills, cough, vomiting or diarrhea. No urinary or BM changes. She reports a prior echo 18 years ago for murmur picked up when she had a miscarriage, results apparently "normal."  Upon ED arrival, EKG reveals septal, inferolateral ST scooping/depression. POC trop-I neg x 2. BMET reveals mild hypokalemia at 3.2, otherwise WNL. CBC WNL.   Problem List: Past Medical History  Diagnosis Date  . Depression   . GERD (gastroesophageal reflux disease)   . Allergy   . Arrhythmia   . Cardiac murmur   . Hypertension   . Thyroid disease   . Panic attack   . Anxiety   . Hyperlipidemia     Past Surgical History  Procedure Date  . Tonsillectomy 1969  . Abdominal hysterectomy 1995  . Hysteroscopy 1994  . Carpal tunnel  release     1988, 1994     Allergies:  Allergies  Allergen Reactions  . Erythromycin     hives  . Iodine     hives  . Sulfa Antibiotics     hives  . Zithromax (Azithromycin Dihydrate)     hives    Home Medications: Prior to Admission medications   Medication Sig Start Date End Date Taking? Authorizing Provider  levothyroxine (SYNTHROID, LEVOTHROID) 112 MCG tablet Take 112 mcg by mouth daily. 04/18/12  Yes Kristian Covey, MD  omeprazole (PRILOSEC) 20 MG capsule Take 20 mg by mouth daily.     Yes Historical Provider, MD  psyllium (METAMUCIL) 58.6 % powder Take 1 packet by mouth daily.     Yes Historical Provider, MD    Inpatient Medications:     . aspirin  325 mg Oral STAT  . potassium chloride  40 mEq Oral Once    (Not in a hospital admission)  Family History  Problem Relation Age of Onset  . Cancer Mother     breast  . Cancer Father     lung  . Heart attack Maternal Grandfather 59     History   Social History  . Marital Status: Married    Spouse Name: N/A    Number of Children: N/A  . Years of Education: N/A   Occupational History  . Not on file.   Social History Main Topics  . Smoking status: Former Smoker -- 0.5 packs/day for 5 years  Types: Cigarettes    Quit date: 05/04/2012  . Smokeless tobacco: Never Used  . Alcohol Use: Yes     social  . Drug Use: No  . Sexually Active: No   Other Topics Concern  . Not on file   Social History Narrative   Lives in [place].      Review of Systems: General: negative for chills, fever, night sweats or weight changes.  Cardiovascular: positive for chest pain, shortness of breath, diaphoresis, negative dyspnea on exertion, edema, orthopnea, palpitations, paroxysmal nocturnal dyspnea Dermatological: negative for rash Respiratory: negative for cough or wheezing Urologic: negative for hematuria Abdominal: positive for nausea, negative for vomiting, diarrhea, bright red blood per rectum, melena, or  hematemesis Neurologic:  negative for visual changes, syncope, or dizziness All other systems reviewed and are otherwise negative except as noted above.  Physical Exam: Blood pressure 102/75, pulse 69, temperature 97.8 F (36.6 C), temperature source Oral, resp. rate 9, SpO2 100.00%.   General:  Well developed, well nourished, anxious-appearing, in no acute distress. Head: Normocephalic, atraumatic, sclera non-icteric, no xanthomas, nares are without discharge.  Neck: Negative for carotid bruits. JVD not elevated. Lungs: Clear bilaterally to auscultation without wheezes, rales, or rhonchi. Breathing is unlabored. Heart: RRR with S1 S2. No murmurs, rubs, or gallops appreciated. Abdomen: Soft, non-tender, non-distended with normoactive bowel sounds. No hepatomegaly. No rebound/guarding. No obvious abdominal masses. Msk:  Strength and tone appears normal for age. Extremities: No clubbing, cyanosis or edema.  Distal pedal pulses are 2+ and equal bilaterally. Neuro: Alert and oriented X 3. Moves all extremities spontaneously. Psych:  Responds to questions appropriately with a normal affect.  Labs: Recent Labs  Basename 05/04/12 1315   WBC 8.3   HGB 13.9   HCT 38.6   MCV 90.2   PLT 240   Lab 05/04/12 1315  NA 135  K 3.2*  CL 101  CO2 20  BUN 13  CREATININE 0.73  CALCIUM 9.5  PROT --  BILITOT --  ALKPHOS --  ALT --  AST --  AMYLASE --  LIPASE --  GLUCOSE 94   Radiology/Studies: No results found.  EKG: NSR, ST scooping/depression (<33mm ST depression) V3-V6; II, III, aVF  ASSESSMENT AND PLAN:   1. Chest pain- atypical for cardiac etiology. Described as sharp and aggravated on inspiration. No prior episodes of chest pain. She does have a history of anxiety and notes increased stress at work. 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 EKG reveals diffuse nonspecific changes. Troponin-I neg x 2. She is mildly hypokalemic. This may be attributed to a  musculoskeletal etiology with underlying dehydration evidenced by her hypokalemia. Subsequent anxiety/stress likely contributing. She does, however, have significant cardiac risk factors. Will admit for ACS rule-out and plan exercise Myoview in the AM.   - Cycle cardiac biomarkers  - Continue low-dose ASA, will add statin, NTG SL PRN  - Hydrate overnight  - Heart healthy diet now  - NPO midnight   - CBC/BMET/EKG tomorrow AM  - Morphine 2mg  IV x 1 for pain  - Exercise Myoview tomorrow  - CXR, D-dimer   2. Hypokalemia- 3.2 on CBC.   - 40 mEq KCl now and again in 4 hours  3. HTN- well-controlled  - Continue to monitor  4. HL- LDL 185 on lipid panel last month, no mention of statin on home meds  - Will start on atorvastatin  5. Tobacco abuse- stressed cessation; willing to quit  6. GERD-   -  Continue equivalent-dose of outpatient PPI  7. Hypothyroidism  - Continue outpatient levothyroxine  8. Anxiety- noted on prior PCP office notes, severe at times, likely contributing to symptomatology  - PRN anxiolytics   Signed, R. Hurman Horn, PA-C 05/04/2012, 3:31 PM    Addendum: Patient's chest x-ray returned revealing a 9 mm spiculated nodule present in the RML suspicious for neoplasm. Patient continues to smoke tobacco as above. Will proceed with follow-up CT recommendation; noncontrast as patient does have an iodine allergy. This may represent etiology of her chest pain.   Jacqulyn Bath, PA-C 05/04/2012 4:58 PM   I have seen, examined the patient, and reviewed the above assessment and plan.  Changes to above are made where necessary.  The patient presents with chest pain with both typical and atypical features.  We will admit to telemetry and rule out MI with serial CMs.  If CMs are negative, will plan GXT myoview in the am.  The importance of smoking cessation was discussed at length.  In addition, CT scan today reveals a granuloma within the RML.  Follow-up CT in 1 year is  advised.  Pt is informed of this result.  Co Sign: Hillis Range, MD 05/04/2012 8:52 PM

## 2012-05-04 NOTE — ED Provider Notes (Signed)
History     CSN: [id number]  Arrival date & time 05/04/12  1256   First MD Initiated Contact with Patient 05/04/12 1331      Chief Complaint  Patient presents with  . Chest Pain    (Consider location/radiation/quality/duration/timing/severity/associated sxs/prior treatment) HPI Complains of left anterior chest pain radiating to left arm onset while at work today accompanied by shortness of breath nausea and sweatiness. Presently she feels generally "drained" and discomfort is minimal. Pain has a pleuritic component. Improving spontaneously without treatment. Cardiac risk factors smoker, hyperlipidemia , remote family history i.e. grandfather had MI in his 32s Past Medical History  Diagnosis Date  . Depression   . GERD (gastroesophageal reflux disease)   . Allergy   . Arrhythmia   . Cardiac murmur   . Hypertension   . Chronic kidney disease   . Thyroid disease   . Panic attack     Past Surgical History  Procedure Date  . Tonsillectomy 1969  . Abdominal hysterectomy 1995  . Hysteroscopy 1994  . Carpal tunnel release     1988, 1994    Family History  Problem Relation Age of Onset  . Cancer Mother     breast  . Cancer Father     lung    History  Substance Use Topics  . Smoking status: Current Everyday Smoker -- 0.5 packs/day for 5 years    Types: Cigarettes  . Smokeless tobacco: Not on file  . Alcohol Use: Yes     social    OB History    Grav Para Term Preterm Abortions TAB SAB Ect Mult Living                  Review of Systems  Constitutional: Positive for fatigue.  HENT: Negative.   Respiratory: Positive for shortness of breath.   Cardiovascular: Positive for chest pain.  Gastrointestinal: Positive for nausea.  Musculoskeletal: Negative.   Skin: Negative.   Neurological: Negative.   Hematological: Negative.   Psychiatric/Behavioral: Negative.     Allergies  Erythromycin; Iodine; Sulfa antibiotics; and Zithromax  Home Medications   Current  Outpatient Rx  Name Route Sig Dispense Refill  . LEVOTHYROXINE SODIUM 112 MCG PO TABS Oral Take 112 mcg by mouth daily.    Marland Kitchen OMEPRAZOLE 20 MG PO CPDR Oral Take 20 mg by mouth daily.      . PSYLLIUM 58.6 % PO POWD Oral Take 1 packet by mouth daily.        BP 133/86  Temp 97.8 F (36.6 C) (Oral)  SpO2 99%  Physical Exam  Nursing note and vitals reviewed. Constitutional: She appears well-developed and well-nourished.  HENT:  Head: Normocephalic and atraumatic.  Eyes: Conjunctivae are normal. Pupils are equal, round, and reactive to light.  Neck: Neck supple. No tracheal deviation present. No thyromegaly present.  Cardiovascular: Normal rate and regular rhythm.   No murmur heard. Pulmonary/Chest: Effort normal and breath sounds normal.  Abdominal: Soft. Bowel sounds are normal. She exhibits no distension. There is no tenderness.  Musculoskeletal: Normal range of motion. She exhibits no edema and no tenderness.  Neurological: She is alert. Coordination normal.  Skin: Skin is warm and dry. No rash noted.  Psychiatric: She has a normal mood and affect.    ED Course  Procedures (including critical care time)  Date: 05/04/2012  Rate: 85  Rhythm: normal sinus rhythm  QRS Axis: normal  Intervals: normal  ST/T Wave abnormalities: ST elevations laterally and ST depressions inferiorly  Conduction Disutrbances:none  Narrative Interpretation:   Old EKG Reviewed: none available   Labs Reviewed  CBC  POCT I-STAT TROPONIN I  BASIC METABOLIC PANEL   No results found.  Results for orders placed during the hospital encounter of 05/04/12  CBC      Component Value Range   WBC 8.3  4.0 - 10.5 K/uL   RBC 4.28  3.87 - 5.11 MIL/uL   Hemoglobin 13.9  12.0 - 15.0 g/dL   HCT 16.1  09.6 - 04.5 %   MCV 90.2  78.0 - 100.0 fL   MCH 32.5  26.0 - 34.0 pg   MCHC 36.0  30.0 - 36.0 g/dL   RDW 40.9  81.1 - 91.4 %   Platelets 240  150 - 400 K/uL  BASIC METABOLIC PANEL      Component Value Range     Sodium 135  135 - 145 mEq/L   Potassium 3.2 (*) 3.5 - 5.1 mEq/L   Chloride 101  96 - 112 mEq/L   CO2 20  19 - 32 mEq/L   Glucose, Bld 94  70 - 99 mg/dL   BUN 13  6 - 23 mg/dL   Creatinine, Ser 7.82  0.50 - 1.10 mg/dL   Calcium 9.5  8.4 - 95.6 mg/dL   GFR calc non Af Amer >90  >90 mL/min   GFR calc Af Amer >90  >90 mL/min  POCT I-STAT TROPONIN I      Component Value Range   Troponin i, poc 0.00  0.00 - 0.08 ng/mL   Comment 3           POCT I-STAT TROPONIN I      Component Value Range   Troponin i, poc 0.00  0.00 - 0.08 ng/mL   Comment 3            No results found.  No diagnosis found.  2:30 PM patient pain-free after treatment with 2 sublingual nitroglycerin. Cardiology consult called to evaluate patient for admission  MDM  Patient has heart score of 5 History risk factors and EKG are concerning for acute coronary syndrome and unstable angina Diagnosis #1 unstable angina #2 hypokalemia #3 tobacco abuse        Doug Sou, MD 05/04/12 [phone]

## 2012-05-04 NOTE — Telephone Encounter (Signed)
Husband calling at approx. 12:4O pm.   States his wife / pt. called him  from her job c/o chest pain and left arm pain with SOB.  He has now picked her up from work and is driving her to the [facility] ER for eval.  Will forward this note to Dr. Kern Alberta to inform.

## 2012-05-04 NOTE — ED Notes (Signed)
[phone]: pt husband Will - call when pt transferred to room

## 2012-05-05 ENCOUNTER — Observation Stay (HOSPITAL_COMMUNITY): Payer: 59

## 2012-05-05 ENCOUNTER — Encounter (HOSPITAL_COMMUNITY): Payer: Self-pay | Admitting: Cardiology

## 2012-05-05 DIAGNOSIS — R079 Chest pain, unspecified: Secondary | ICD-10-CM

## 2012-05-05 LAB — BASIC METABOLIC PANEL
BUN: 15 mg/dL (ref 6–23)
Chloride: 106 mEq/L (ref 96–112)
Glucose, Bld: 97 mg/dL (ref 70–99)
Potassium: 4.2 mEq/L (ref 3.5–5.1)
Sodium: 138 mEq/L (ref 135–145)

## 2012-05-05 LAB — CBC
HCT: 38.2 % (ref 36.0–46.0)
Hemoglobin: 13 g/dL (ref 12.0–15.0)
RBC: 4.04 MIL/uL (ref 3.87–5.11)
WBC: 5.9 10*3/uL (ref 4.0–10.5)

## 2012-05-05 LAB — CARDIAC PANEL(CRET KIN+CKTOT+MB+TROPI)
Relative Index: INVALID (ref 0.0–2.5)
Troponin I: <0.3 ng/mL (ref <0.30)

## 2012-05-05 MED ORDER — TECHNETIUM TC 99M TETROFOSMIN IV KIT
30.0000 | PACK | Freq: Once | INTRAVENOUS | Status: AC | PRN
  Administered 2012-05-05: 30 via INTRAVENOUS

## 2012-05-05 MED ORDER — ATORVASTATIN CALCIUM 20 MG PO TABS: 20.0000 mg | ORAL_TABLET | Freq: Every day | ORAL | Status: DC

## 2012-05-05 MED ORDER — TECHNETIUM TC 99M TETROFOSMIN IV KIT
10.0000 | PACK | Freq: Once | INTRAVENOUS | Status: AC | PRN
  Administered 2012-05-05: 10 via INTRAVENOUS

## 2012-05-05 MED ORDER — PSYLLIUM 95 % PO PACK: 1.0000 | PACK | Freq: Every day | ORAL | Status: DC

## 2012-05-05 MED ORDER — REGADENOSON 0.4 MG/5ML IV SOLN
0.4000 mg | Freq: Once | INTRAVENOUS | Status: AC
  Administered 2012-05-05: 0.4 mg via INTRAVENOUS

## 2012-05-05 NOTE — Discharge Summary (Signed)
Discharge Summary   Patient ID: Tara Cameron MRN: [id number], DOB/AGE: February 19, 1961 51 y.o.  Primary MD: Kristian Covey, MD Primary Cardiologist: None Admit date: 05/04/2012 D/C date:     05/05/2012      Primary Discharge Diagnoses:  1. Chest Pain, Noncardiac  - No objective evidence of cardiac ischemia  - Normal Myoview 05/05/12   - ? GI etiology vs costochondritis  - F/u with PCP  2. Abnormal CXR Finding  - 9mm spiculated RML pulmonary nodule found on CXR, f/u chest CT noted correspondence with benign calcified granuloma, recommendations for f/u CT in one year  3. Hyperlipidemia  - LDL 185, statin initiated this admission, will need f/u lipids/LFTs in 6-8wks  4. Tobacco Abuse  Secondary Discharge Diagnoses:  1. Hypertension 2. GERD 3. Hypothyroidism 4. Anxiety 5. Panic attack  6. Depression  7. Tonsillectomy  8. Abdominal hysterectomy  9. Carpal tunnel release   Allergies Allergies  Allergen Reactions  . Erythromycin     hives  . Iodine     hives  . Sulfa Antibiotics     hives  . Zithromax (Azithromycin Dihydrate)     hives    Diagnostic Studies/Procedures:   05/04/2012 - Chest 2 View Findings: No airspace disease or effusion is present.  There is a spiculated nodule in the superior right middle lobe measuring 9 mm seen on both frontal lateral projections.  Follow-up chest CT is recommended, preferably with infusion is seen. Monitoring leads are projected over the chest.  Cardiopericardial silhouette appears within normal limits.  IMPRESSION: 9 mm spiculated nodule present in the right middle lobe suspicious for neoplasm.  Follow-up chest CT, preferably with infusion recommended.  No acute abnormality.   05/04/2012 - Ct Chest Wo Contrast Findings: Right middle lobe calcified granuloma is associated with right hilar calcifications.  This is consistent with old granulomatous disease.    3 mm right middle lobe nodule on image 34.  Irregular density in the  right upper lobe peripherally on image 18.  This is only 3 mm.  No pneumothorax and no pleural effusion.  No abnormal mediastinal adenopathy.  No pericardial effusion.  No destructive bone lesion.  IMPRESSION: Radiographic abnormality corresponds to a benign calcified granuloma.  There is a 3 mm soft tissue nodule in the right middle lobe not visualized on the radiograph but present. If the patient is at high risk for bronchogenic carcinoma, follow-up chest CT at 1 year is recommended.  If the patient is at low risk, no follow-up is needed.   05/05/2012 - Nm Myocar Multi W/spect W/wall Motion / Ef Findings:  Technique: Study is adequate.  Perfusion:  There are no relative decreased counts on stress or rest to suggest reversible ischemia or infarction. Decreased counts within the mid and apical segment of the antral wall are fixed on rest and stress and may relate to attenuation.  Wall motion:  No focal wall motion abnormality.  Normal contractility.  Left ventricular ejection fraction:  Calculated left ventricular ejection fraction =  70%  IMPRESSION:  1.  No reversible ischemia or infarction. 2.  Normal wall motion. 3.  Left ventricular ejection fraction equal 70%    History of Present Illness: 51 y.o. female w/ the above medical problems who presented to [facility] on 05/04[facility]12 with complaints of chest pain.  She reported experiencing sharp left-sided chest pain with radiation to her left shoulder blade and arm associated with sob and nausea while picking up boxes at work prompting her  to present to the ED.  Hospital Course: EKG revealed NSR with septal, inferolateral ST scooping/depression. CXR was without acute cardiopulmonary abnormalities, but did show 9mm spiculated RML pulmonary nodule. Labs were significant for normal troponin, normal DDimer, K+ 3.2, otherwise unremarkable CBC/BMET. She was admitted for further evaluation and treatment.   Chest CT was obtained due to the abnormal CXR  finding and noted correspondence with benign calcified granuloma with recommendations for f/u CT in one year. Cardiac enzymes were cycled and remained negative. Exercise myoview on 05/05/12 was without ischemia, infarction or WMAs, EF 70%. It was felt her chest pain was noncardiac in etiology and did not require any further ischemic evaluation. She was initiated on statin therapy during this admission and will need follow up lipid panel and LFTs in 6-8wks.  She was seen and evaluated by Dr. Excell Seltzer who felt she was stable for discharge home with plans for follow up as scheduled below.  Discharge Vitals: Blood pressure 149/75, pulse 53, temperature 97.9 F (36.6 C), temperature source Oral, resp. rate 16, height 5\' 5"  (1.651 m), weight 151 lb 3.8 oz (68.6 kg), SpO2 100.00%.  Labs: Component Value Date   WBC 5.9 05/05/2012   HGB 13.0 05/05/2012   HCT 38.2 05/05/2012   MCV 94.6 05/05/2012   PLT 226 05/05/2012    Lab 05/05/12 0442  NA 138  K 4.2  CL 106  CO2 25  BUN 15  CREATININE 0.86  CALCIUM 9.1  GLUCOSE 97   Basename 05/05/12 0442 05/04/12 2206 05/04/12 1440  CKTOTAL 79 73 67  CKMB 3.0 2.5 1.9  TROPONINI <0.30 <0.30 <0.30   Component Value Date   DDIMER 0.23 05/04/2012     04/13/2012 09:25  Cholesterol 252 (H)  Triglycerides 109.0  HDL 57.30  Direct LDL 185.6  VLDL 21.8  Total CHOL/HDL Ratio 4     Discharge Medications   Medication List  As of 05/05/2012  2:17 PM   TAKE these medications         atorvastatin 20 MG tablet   Commonly known as: LIPITOR   Take 1 tablet (20 mg total) by mouth at bedtime.      levothyroxine 112 MCG tablet   Commonly known as: SYNTHROID, LEVOTHROID   Take 112 mcg by mouth daily.      omeprazole 20 MG capsule   Commonly known as: PRILOSEC   Take 20 mg by mouth daily.      psyllium 58.6 % powder   Commonly known as: METAMUCIL   Take 1 packet by mouth daily.            Disposition   Discharge Orders    Future Orders Please Complete  By Expires   Diet - low sodium heart healthy      Increase activity slowly      Discharge instructions      Comments:   * Your stress test was normal. There were no findings during this hospitalization to suggest your chest pain is coming from your heart. Please follow up with your primary care provider for further evaluation and management.  * You were started on a cholesterol medication (lipitor) and will need follow up blood work (lipid panel and liver function tests) primary care provider in 6-8wks.  * You will need a follow up chest CT scan in one year due to an abnormality that was found during this hospitalization.  * Please STOP smoking!     Follow-up Information    Follow up with  Kristian Covey, MD. (As needed)    Contact information:   [address] [place] [address] [phone]           Outstanding Labs/Studies:  1. Follow up Chest CT in 1 yr 2. Patient will require follow up lipid panel and liver function tests in 6-8 weeks as we initiated a new statin during this hospitalization   Duration of Discharge Encounter: Greater than 30 minutes including physician and PA time.  Signed, Kacy Conely PA-C 05/05/2012, 2:17 PM

## 2012-05-05 NOTE — Progress Notes (Addendum)
    Subjective:  One "grabbing" episode of l-sided CP last night. No pain this am. No dyspnea.  Objective:  Vital Signs in the last 24 hours: Temp:  [97.8 F (36.6 C)-98.4 F (36.9 C)] 98.1 F (36.7 C) (07/10 2117) Pulse Rate:  [59-83] 60  (07/10 2117) Resp:  [8-18] 18  (07/10 2117) BP: (102-136)/(69-91) 104/69 mmHg (07/10 2117) SpO2:  [97 %-100 %] 97 % (07/10 2117) Weight:  [68.584 kg (151 lb 3.2 oz)] 68.584 kg (151 lb 3.2 oz) (07/10 1804)  Intake/Output from previous day:    Physical Exam: Pt is alert and oriented, NAD HEENT: normal Neck: JVP - normal Lungs: CTA bilaterally CV: RRR without murmur or gallop Abd: soft, NT, Positive BS, no hepatomegaly Ext: no C/C/E, distal pulses intact and equal Skin: warm/dry no rash   Lab Results:  Basename 05/05/12 0442 05/04/12 1315  WBC 5.9 8.3  HGB 13.0 13.9  PLT 226 240    Basename 05/05/12 0442 05/04/12 1315  NA 138 135  K 4.2 3.2*  CL 106 101  CO2 25 20  GLUCOSE 97 94  BUN 15 13  CREATININE 0.86 0.73    Basename 05/05/12 0442 05/04/12 2206  TROPONINI <0.30 <0.30    Tele: sinus rhythm, personally reviewed.  Assessment/Plan:  1. Atypical CP. EKG reviewed and shows a t wave abnormality considering for anteroseptal ischemia. Cardiac enzymes negative. D-dimer negative. For Myoview stress test today. If negative ok for discharge. Consider GI etiology versus costochondritis if Myoview negative.  2. Tobacco - cessation discussed and pt motivated to quit. She states "I am done."  3. Dispo - home if Myoview negative. Follow-up with Dr Caryl Never.  Tonny Bollman, M.D. 05/05/2012, 6:45 AM

## 2012-05-05 NOTE — Progress Notes (Signed)
Patient verbalizes understanding of discharge instructions. [facility] notified regarding patients question about returning to work and that she works in the heat. Patient informed that there is no cardiac reason she can not return to work tomorrow and that her working in the heat is not something we can not change and that she may need to consider a change in jobs to avoid being in the heat. Patient is stable for discharge and awaits husbands return for transport to home. Ginny Forth

## 2012-05-05 NOTE — Care Management Note (Signed)
    Page 1 of 1   05/05/2012     2:28:40 PM   CARE MANAGEMENT NOTE 05/05/2012  Patient:  Tara Cameron, Tara Cameron   Account Number:  [id number]  Date Initiated:  05/05/2012  Documentation initiated by:  Lanier Clam  Subjective/Objective Assessment:   ADMITTED W/CHEST PAIN.     Action/Plan:   FROM HOME   Anticipated DC Date:  05/05/2012   Anticipated DC Plan:  HOME/SELF CARE      DC Planning Services  CM consult      Choice offered to / List presented to:             Status of service:  Completed, signed off Medicare Important Message given?   (If response is "NO", the following Medicare IM given date fields will be blank) Date Medicare IM given:   Date Additional Medicare IM given:    Discharge Disposition:  HOME/SELF CARE  Per UR Regulation:  Reviewed for med. necessity/level of care/duration of stay  If discussed at Long Length of Stay Meetings, dates discussed:    Comments:  05/05/12 [facility] Laqueshia Cihlar RN,BSN NCM [phone]

## 2012-05-05 NOTE — Discharge Summary (Signed)
Agree as outlined. See my progress note this same date.  Tara Cameron 05/05/2012

## 2012-05-13 ENCOUNTER — Encounter: Payer: Self-pay | Admitting: Family Medicine

## 2012-05-13 ENCOUNTER — Ambulatory Visit (INDEPENDENT_AMBULATORY_CARE_PROVIDER_SITE_OTHER): Payer: PRIVATE HEALTH INSURANCE | Admitting: Family Medicine

## 2012-05-13 VITALS — BP 120/90 | Temp 98.5°F | Wt 154.0 lb

## 2012-05-13 DIAGNOSIS — R911 Solitary pulmonary nodule: Secondary | ICD-10-CM

## 2012-05-13 DIAGNOSIS — F17200 Nicotine dependence, unspecified, uncomplicated: Secondary | ICD-10-CM

## 2012-05-13 DIAGNOSIS — E785 Hyperlipidemia, unspecified: Secondary | ICD-10-CM

## 2012-05-13 DIAGNOSIS — R079 Chest pain, unspecified: Secondary | ICD-10-CM

## 2012-05-13 DIAGNOSIS — Z720 Tobacco use: Secondary | ICD-10-CM

## 2012-05-13 MED ORDER — ATORVASTATIN CALCIUM 20 MG PO TABS: 20.0000 mg | ORAL_TABLET | Freq: Every day | ORAL | Status: DC

## 2012-05-13 NOTE — Progress Notes (Signed)
Subjective:    Patient ID: Tara Cameron, female    DOB: 1961-02-01, 51 y.o.   MRN: [id number]  HPI  Hospital followup. Patient was admitted on 05/04/2012 with chest pain. She was at work and recalls getting overheated. She described sharp pain substernal with increased generalized weakness. She then had some mild dyspnea. She went to emergency room. EKG apparently revealed normal sinus rhythm with septal and inferolateral ST depression.. Cardiac enzymes negative. Patient had normal Myoview screening day following admission. Etiology of pain unclear. She's had no recurrence since then. She had LDL 185 and Lipitor prescribed though she has not yet started. Chest x-ray revealed 9 mm spiculated right middle lobe pulmonary nodule with CT chest 3 mm benign calcified granuloma with recommended followup in one year. Gastro-intestinal reflux which has been fairly well controlled. Patient had slightly low potassium on admission and this was corrected prior to discharge.  Nuclear study revealed no evidence for ischemia and normal wall motion. Left ventricular ejection fraction normal. Patient quit smoking during hospitalization and has not resumed.  Past Medical History  Diagnosis Date  . Depression   . GERD (gastroesophageal reflux disease)   . Allergy   . Arrhythmia   . Cardiac murmur   . Hypertension   . Thyroid disease   . Panic attack   . Anxiety   . Hyperlipidemia   . Chest pain     normal myoview 04/2012  . Abnormal CXR     04/2012 - 9mm spiculated RML pulmonary nodule found on CXR, f/u chest CT noted correspondence with benign calcified granuloma, recommendations for f/u CT in one year  . Tobacco abuse    Past Surgical History  Procedure Date  . Tonsillectomy 1969  . Abdominal hysterectomy 1995  . Hysteroscopy 1994  . Carpal tunnel release     1988, 1994    reports that she quit smoking 10 days ago. Her smoking use included Cigarettes. She has a 2.5 pack-year smoking history.  She has never used smokeless tobacco. She reports that she drinks alcohol. She reports that she does not use illicit drugs. family history includes Cancer in her father and mother and Heart attack (age of onset:55) in her maternal grandfather. Allergies  Allergen Reactions  . Erythromycin     hives  . Iodine     hives  . Sulfa Antibiotics     hives  . Zithromax (Azithromycin Dihydrate)     hives     Review of Systems  Constitutional: Negative for fever, appetite change and unexpected weight change.  Respiratory: Negative for cough and shortness of breath.   Cardiovascular: Negative for chest pain, palpitations and leg swelling.  Gastrointestinal: Negative for abdominal pain.  Neurological: Negative for dizziness.       Objective:   Physical Exam  Constitutional: She appears well-developed and well-nourished.  Neck: Neck supple.  Cardiovascular: Normal rate and regular rhythm.   Pulmonary/Chest: Effort normal and breath sounds normal. No respiratory distress. She has no wheezes. She has no rales.  Musculoskeletal: She exhibits no edema.  Lymphadenopathy:    She has no cervical adenopathy.          Assessment & Plan:  Recent chest pain and ruled out for MI with normal nuclear stress test. Patient to follow up promptly for any recurrent symptoms.  History of smoking.  Congratulated on stopping. Information on smoking cessation given.  Hyperlipidemia. Start Lipitor 20 mg daily. Recheck lipid and hepatic 6-8 weeks  Small probable benign calcified granuloma  on CT chest. Consider followup in 1 year

## 2012-05-13 NOTE — Patient Instructions (Addendum)
Smoking Cessation This document explains the best ways for you to quit smoking and new treatments to help. It lists new medicines that can double or triple your chances of quitting and quitting for good. It also considers ways to avoid relapses and concerns you may have about quitting, including weight gain. NICOTINE: A POWERFUL ADDICTION If you have tried to quit smoking, you know how hard it can be. It is hard because nicotine is a very addictive drug. For some people, it can be as addictive as heroin or cocaine. Usually, people make 2 or 3 tries, or more, before finally being able to quit. Each time you try to quit, you can learn about what helps and what hurts. Quitting takes hard work and a lot of effort, but you can quit smoking. QUITTING SMOKING IS ONE OF THE MOST IMPORTANT THINGS YOU WILL EVER DO.  You will live longer, feel better, and live better.   The impact on your body of quitting smoking is felt almost immediately:   Within 20 minutes, blood pressure decreases. Pulse returns to its normal level.   After 8 hours, carbon monoxide levels in the blood return to normal. Oxygen level increases.   After 24 hours, chance of heart attack starts to decrease. Breath, hair, and body stop smelling like smoke.   After 48 hours, damaged nerve endings begin to recover. Sense of taste and smell improve.   After 72 hours, the body is virtually free of nicotine. Bronchial tubes relax and breathing becomes easier.   After 2 to 12 weeks, lungs can hold more air. Exercise becomes easier and circulation improves.   Quitting will reduce your risk of having a heart attack, stroke, cancer, or lung disease:   After 1 year, the risk of coronary heart disease is cut in half.   After 5 years, the risk of stroke falls to the same as a nonsmoker.   After 10 years, the risk of lung cancer is cut in half and the risk of other cancers decreases significantly.   After 15 years, the risk of coronary heart  disease drops, usually to the level of a nonsmoker.   If you are pregnant, quitting smoking will improve your chances of having a healthy baby.   The people you live with, especially your children, will be healthier.   You will have extra money to spend on things other than cigarettes.  FIVE KEYS TO QUITTING Studies have shown that these 5 steps will help you quit smoking and quit for good. You have the best chances of quitting if you use them together: 1. Get ready.  2. Get support and encouragement.  3. Learn new skills and behaviors.  4. Get medicine to reduce your nicotine addiction and use it correctly.  5. Be prepared for relapse or difficult situations. Be determined to continue trying to quit, even if you do not succeed at first.  1. GET READY  Set a quit date.   Change your environment.   Get rid of ALL cigarettes, ashtrays, matches, and lighters in your home, car, and place of work.   Do not let people smoke in your home.   Review your past attempts to quit. Think about what worked and what did not.   Once you quit, do not smoke. NOT EVEN A PUFF!  2. GET SUPPORT AND ENCOURAGEMENT Studies have shown that you have a better chance of being successful if you have help. You can get support in many ways.  Tell   your family, friends, and coworkers that you are going to quit and need their support. Ask them not to smoke around you.   Talk to your caregivers (doctor, dentist, nurse, pharmacist, psychologist, and/or smoking counselor).   Get individual, group, or telephone counseling and support. The more counseling you have, the better your chances are of quitting. Programs are available at local hospitals and health centers. Call your local health department for information about programs in your area.   Spiritual beliefs and practices may help some smokers quit.   Quit meters are small computer programs online or downloadable that keep track of quit statistics, such as amount  of "quit-time," cigarettes not smoked, and money saved.   Many smokers find one or more of the many self-help books available useful in helping them quit and stay off tobacco.  3. LEARN NEW SKILLS AND BEHAVIORS  Try to distract yourself from urges to smoke. Talk to someone, go for a walk, or occupy your time with a task.   When you first try to quit, change your routine. Take a different route to work. Drink tea instead of coffee. Eat breakfast in a different place.   Do something to reduce your stress. Take a hot bath, exercise, or read a book.   Plan something enjoyable to do every day. Reward yourself for not smoking.   Explore interactive web-based programs that specialize in helping you quit.  4. GET MEDICINE AND USE IT CORRECTLY Medicines can help you stop smoking and decrease the urge to smoke. Combining medicine with the above behavioral methods and support can quadruple your chances of successfully quitting smoking. The [place] Food and Drug Administration (FDA) has approved 7 medicines to help you quit smoking. These medicines fall into 3 categories.  Nicotine replacement therapy (delivers nicotine to your body without the negative effects and risks of smoking):   Nicotine gum: Available over-the-counter.   Nicotine lozenges: Available over-the-counter.   Nicotine inhaler: Available by prescription.   Nicotine nasal spray: Available by prescription.   Nicotine skin patches (transdermal): Available by prescription and over-the-counter.   Antidepressant medicine (helps people abstain from smoking, but how this works is unknown):   Bupropion sustained-release (SR) tablets: Available by prescription.   Nicotinic receptor partial agonist (simulates the effect of nicotine in your brain):   Varenicline tartrate tablets: Available by prescription.   Ask your caregiver for advice about which medicines to use and how to use them. Carefully read the information on the package.    Everyone who is trying to quit may benefit from using a medicine. If you are pregnant or trying to become pregnant, nursing an infant, you are under age 18, or you smoke fewer than 10 cigarettes per day, talk to your caregiver before taking any nicotine replacement medicines.   You should stop using a nicotine replacement product and call your caregiver if you experience nausea, dizziness, weakness, vomiting, fast or irregular heartbeat, mouth problems with the lozenge or gum, or redness or swelling of the skin around the patch that does not go away.   Do not use any other product containing nicotine while using a nicotine replacement product.   Talk to your caregiver before using these products if you have diabetes, heart disease, asthma, stomach ulcers, you had a recent heart attack, you have high blood pressure that is not controlled with medicine, a history of irregular heartbeat, or you have been prescribed medicine to help you quit smoking.  5. BE PREPARED FOR RELAPSE OR   DIFFICULT SITUATIONS  Most relapses occur within the first 3 months after quitting. Do not be discouraged if you start smoking again. Remember, most people try several times before they finally quit.   You may have symptoms of withdrawal because your body is used to nicotine. You may crave cigarettes, be irritable, feel very hungry, cough often, get headaches, or have difficulty concentrating.   The withdrawal symptoms are only temporary. They are strongest when you first quit, but they will go away within 10 to 14 days.  Here are some difficult situations to watch for:  Alcohol. Avoid drinking alcohol. Drinking lowers your chances of successfully quitting.   Caffeine. Try to reduce the amount of caffeine you consume. It also lowers your chances of successfully quitting.   Other smokers. Being around smoking can make you want to smoke. Avoid smokers.   Weight gain. Many smokers will gain weight when they quit, usually  less than 10 pounds. Eat a healthy diet and stay active. Do not let weight gain distract you from your main goal, quitting smoking. Some medicines that help you quit smoking may also help delay weight gain. You can always lose the weight gained after you quit.   Bad mood or depression. There are a lot of ways to improve your mood other than smoking.  If you are having problems with any of these situations, talk to your caregiver. SPECIAL SITUATIONS AND CONDITIONS Studies suggest that everyone can quit smoking. Your situation or condition can give you a special reason to quit.  Pregnant women/new mothers: By quitting, you protect your baby's health and your own.   Hospitalized patients: By quitting, you reduce health problems and help healing.   Heart attack patients: By quitting, you reduce your risk of a second heart attack.   Lung, head, and neck cancer patients: By quitting, you reduce your chance of a second cancer.   Parents of children and adolescents: By quitting, you protect your children from illnesses caused by secondhand smoke.  QUESTIONS TO THINK ABOUT Think about the following questions before you try to stop smoking. You may want to talk about your answers with your caregiver.  Why do you want to quit?   If you tried to quit in the past, what helped and what did not?   What will be the most difficult situations for you after you quit? How will you plan to handle them?   Who can help you through the tough times? Your family? Friends? Caregiver?   What pleasures do you get from smoking? What ways can you still get pleasure if you quit?  Here are some questions to ask your caregiver:  How can you help me to be successful at quitting?   What medicine do you think would be best for me and how should I take it?   What should I do if I need more help?   What is smoking withdrawal like? How can I get information on withdrawal?  Quitting takes hard work and a lot of effort,  but you can quit smoking. FOR MORE INFORMATION  Smokefree.gov (http://www.smokefree.gov) provides free, accurate, evidence-based information and professional assistance to help support the immediate and long-term needs of people trying to quit smoking. Document Released: 10/06/2001 Document Revised: 10/01/2011 Document Reviewed: 07/29/2009 [employer] Patient Information 2012 [employer]. 

## 2012-05-14 DIAGNOSIS — R911 Solitary pulmonary nodule: Secondary | ICD-10-CM | POA: Insufficient documentation

## 2012-06-12 ENCOUNTER — Encounter (HOSPITAL_COMMUNITY): Payer: Self-pay | Admitting: *Deleted

## 2012-06-12 ENCOUNTER — Emergency Department (HOSPITAL_COMMUNITY)
Admission: EM | Admit: 2012-06-12 | Discharge: 2012-06-12 | Disposition: A | Discharge status: Home / Self Care | Payer: 59 | Attending: Emergency Medicine | Admitting: Emergency Medicine

## 2012-06-12 ENCOUNTER — Emergency Department (HOSPITAL_COMMUNITY): Payer: 59

## 2012-06-12 DIAGNOSIS — F3289 Other specified depressive episodes: Secondary | ICD-10-CM | POA: Insufficient documentation

## 2012-06-12 DIAGNOSIS — K219 Gastro-esophageal reflux disease without esophagitis: Secondary | ICD-10-CM | POA: Insufficient documentation

## 2012-06-12 DIAGNOSIS — F329 Major depressive disorder, single episode, unspecified: Secondary | ICD-10-CM | POA: Insufficient documentation

## 2012-06-12 DIAGNOSIS — F411 Generalized anxiety disorder: Secondary | ICD-10-CM | POA: Insufficient documentation

## 2012-06-12 DIAGNOSIS — I10 Essential (primary) hypertension: Secondary | ICD-10-CM | POA: Insufficient documentation

## 2012-06-12 DIAGNOSIS — Z882 Allergy status to sulfonamides status: Secondary | ICD-10-CM | POA: Insufficient documentation

## 2012-06-12 DIAGNOSIS — E079 Disorder of thyroid, unspecified: Secondary | ICD-10-CM | POA: Insufficient documentation

## 2012-06-12 DIAGNOSIS — R0789 Other chest pain: Secondary | ICD-10-CM | POA: Insufficient documentation

## 2012-06-12 DIAGNOSIS — R079 Chest pain, unspecified: Secondary | ICD-10-CM

## 2012-06-12 DIAGNOSIS — F17200 Nicotine dependence, unspecified, uncomplicated: Secondary | ICD-10-CM | POA: Insufficient documentation

## 2012-06-12 DIAGNOSIS — F419 Anxiety disorder, unspecified: Secondary | ICD-10-CM

## 2012-06-12 LAB — BASIC METABOLIC PANEL
CO2: 27 mEq/L (ref 19–32)
Calcium: 9.8 mg/dL (ref 8.4–10.5)
Chloride: 105 mEq/L (ref 96–112)
Potassium: 3.9 mEq/L (ref 3.5–5.1)
Sodium: 141 mEq/L (ref 135–145)

## 2012-06-12 LAB — CBC WITH DIFFERENTIAL/PLATELET
Eosinophils Relative: 2 % (ref 0–5)
Lymphocytes Relative: 28 % (ref 12–46)
Lymphs Abs: 2.1 10*3/uL (ref 0.7–4.0)
MCV: 92.9 fL (ref 78.0–100.0)
Platelets: 258 10*3/uL (ref 150–400)
RBC: 4.34 MIL/uL (ref 3.87–5.11)
WBC: 7.5 10*3/uL (ref 4.0–10.5)

## 2012-06-12 LAB — POCT I-STAT TROPONIN I: Troponin i, poc: 0 ng/mL (ref 0.00–0.08)

## 2012-06-12 MED ORDER — ALPRAZOLAM 0.25 MG PO TABS: 0.2500 mg | ORAL_TABLET | Freq: Every evening | ORAL | Status: DC | PRN
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
## 2012-06-13 ENCOUNTER — Telehealth: Payer: Self-pay | Admitting: Family Medicine
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
## 2012-06-16 ENCOUNTER — Ambulatory Visit (INDEPENDENT_AMBULATORY_CARE_PROVIDER_SITE_OTHER): Payer: PRIVATE HEALTH INSURANCE | Admitting: Licensed Clinical Social Worker

## 2012-06-16 ENCOUNTER — Telehealth: Payer: Self-pay | Admitting: Family Medicine

## 2012-06-16 DIAGNOSIS — F411 Generalized anxiety disorder: Secondary | ICD-10-CM

## 2012-06-16 DIAGNOSIS — F4310 Post-traumatic stress disorder, unspecified: Secondary | ICD-10-CM

## 2012-06-16 DIAGNOSIS — F331 Major depressive disorder, recurrent, moderate: Secondary | ICD-10-CM

## 2012-06-16 NOTE — Telephone Encounter (Signed)
Error

## 2012-06-16 NOTE — Telephone Encounter (Signed)
Patient came in stating that she need a refill of her alprazolam 0.25mg  sent to [facility] in [place]. Please assist.

## 2012-06-16 NOTE — Telephone Encounter (Signed)
Let's see if we can work her in sooner than 07-01-12 since she is apparently have some severe anxiety symptoms.

## 2012-06-17 ENCOUNTER — Ambulatory Visit (INDEPENDENT_AMBULATORY_CARE_PROVIDER_SITE_OTHER): Payer: PRIVATE HEALTH INSURANCE | Admitting: Family Medicine

## 2012-06-17 ENCOUNTER — Encounter: Payer: Self-pay | Admitting: Family Medicine

## 2012-06-17 VITALS — BP 120/84 | Temp 99.3°F | Wt 149.0 lb

## 2012-06-17 DIAGNOSIS — G4700 Insomnia, unspecified: Secondary | ICD-10-CM

## 2012-06-17 DIAGNOSIS — F419 Anxiety disorder, unspecified: Secondary | ICD-10-CM

## 2012-06-17 DIAGNOSIS — F411 Generalized anxiety disorder: Secondary | ICD-10-CM

## 2012-06-17 MED ORDER — QUETIAPINE FUMARATE 50 MG PO TABS: ORAL_TABLET | ORAL | Status: DC

## 2012-06-17 NOTE — Telephone Encounter (Signed)
Patient coming in this am
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
## 2012-06-22 ENCOUNTER — Telehealth: Payer: Self-pay | Admitting: Family Medicine

## 2012-06-22 NOTE — Telephone Encounter (Signed)
FYI, pt has appt this Friday

## 2012-06-22 NOTE — Telephone Encounter (Signed)
Patient calling, she is on the Seroquel as ordered.  She is not sleeping good, awakening 3 or 4 times during the night.  Still waking up with a panic feeling.   She started the medications for panic attacks.   She has a f/u appointment this Friday, 8/30.

## 2012-06-24 ENCOUNTER — Encounter: Payer: Self-pay | Admitting: Family Medicine

## 2012-06-24 ENCOUNTER — Other Ambulatory Visit: Payer: PRIVATE HEALTH INSURANCE

## 2012-06-24 ENCOUNTER — Ambulatory Visit: Payer: PRIVATE HEALTH INSURANCE | Admitting: Licensed Clinical Social Worker

## 2012-06-24 ENCOUNTER — Ambulatory Visit (INDEPENDENT_AMBULATORY_CARE_PROVIDER_SITE_OTHER): Payer: PRIVATE HEALTH INSURANCE | Admitting: Family Medicine

## 2012-06-24 VITALS — BP 130/82 | Temp 98.1°F | Wt 150.0 lb

## 2012-06-24 DIAGNOSIS — F411 Generalized anxiety disorder: Secondary | ICD-10-CM

## 2012-06-24 DIAGNOSIS — F419 Anxiety disorder, unspecified: Secondary | ICD-10-CM

## 2012-06-24 DIAGNOSIS — E785 Hyperlipidemia, unspecified: Secondary | ICD-10-CM

## 2012-06-24 MED ORDER — CLONAZEPAM 0.5 MG PO TABS: 0.5000 mg | ORAL_TABLET | Freq: Two times a day (BID) | ORAL | Status: DC | PRN
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
## 2012-06-28 ENCOUNTER — Telehealth: Payer: Self-pay | Admitting: Family Medicine

## 2012-06-28 NOTE — Telephone Encounter (Signed)
FMLA paper work is completed, however there is not a fax number found on forms.  LMTCB, asking pt for a fax number.

## 2012-06-28 NOTE — Telephone Encounter (Signed)
Pt states she was written out of work until mid September.  Dr. Caryl Never and/or Harriett Sine was to fax over an out of work note along with FMLA paperwork to her employer before today.  The deadline is today.  Please advise the status of this request.

## 2012-06-29 NOTE — Telephone Encounter (Signed)
Tim Lair assisted with getting fax number and faxed, confirmation received.  Will also scan document to chart

## 2012-07-01 ENCOUNTER — Other Ambulatory Visit: Payer: PRIVATE HEALTH INSURANCE

## 2012-07-01 ENCOUNTER — Ambulatory Visit: Payer: PRIVATE HEALTH INSURANCE | Admitting: Family Medicine

## 2012-07-04 ENCOUNTER — Ambulatory Visit (INDEPENDENT_AMBULATORY_CARE_PROVIDER_SITE_OTHER): Payer: PRIVATE HEALTH INSURANCE | Admitting: Family Medicine

## 2012-07-04 ENCOUNTER — Encounter: Payer: Self-pay | Admitting: Family Medicine

## 2012-07-04 VITALS — BP 120/88 | Temp 98.0°F | Wt 152.0 lb

## 2012-07-04 DIAGNOSIS — E785 Hyperlipidemia, unspecified: Secondary | ICD-10-CM

## 2012-07-04 DIAGNOSIS — F411 Generalized anxiety disorder: Secondary | ICD-10-CM

## 2012-07-04 DIAGNOSIS — F419 Anxiety disorder, unspecified: Secondary | ICD-10-CM

## 2012-07-04 LAB — HEPATIC FUNCTION PANEL
ALT: 20 U/L (ref 0–35)
AST: 23 U/L (ref 0–37)
Albumin: 4.3 g/dL (ref 3.5–5.2)
Total Protein: 7.3 g/dL (ref 6.0–8.3)

## 2012-07-04 LAB — LIPID PANEL
HDL: 52.3 mg/dL (ref >39.00)
Total CHOL/HDL Ratio: 3
Triglycerides: 138 mg/dL (ref 0.0–149.0)

## 2012-07-04 NOTE — Patient Instructions (Addendum)
Titrate Seroquel to 3 tablets at night

## 2012-07-04 NOTE — Progress Notes (Signed)
  Subjective:    Patient ID: Tara Cameron, female    DOB: 01/06/1961, 51 y.o.   MRN: [id number]  HPI  Followup anxiety and depression. Refer to prior notes. Currently on Seroquel 50 mg 2 tablets nightly and Klonopin 0.5 mg twice a day. Overall she is some improvement still has increased anxiety. Hesitates to leave the house at times but is making some trips out. Still has occasional racing thoughts. She had significant agitation and felt more anxious with Zoloft previously-per psychiatrist. By previous mood disorders questionnaire we had some concern about possible bipolar illness. She has scheduled followup with psychiatrist 17th of this month. Overall her sleep is some improved but she still has some daytime fatigue. No suicidal ideation. She is more anxiety than depression.   Review of Systems  Constitutional: Negative for fever, chills, appetite change and unexpected weight change.  Neurological: Negative for dizziness and headaches.       Objective:   Physical Exam  Constitutional: She is oriented to person, place, and time. She appears well-developed and well-nourished.  Cardiovascular: Normal rate and regular rhythm.   Pulmonary/Chest: Effort normal and breath sounds normal. No respiratory distress. She has no wheezes. She has no rales.  Neurological: She is alert and oriented to person, place, and time. No cranial nerve deficit.  Psychiatric:       slightly anxious but overall improved.            Assessment & Plan:  Anxiety/depression. Possible bipolar illness. Titrate Seroquel to 150 mg at night. Continue Klonopin 0.5 mg twice a day. Follow through with psychiatry referral.  She has been taken out of work through September 19. Hyperlipidemia.  Check lipid and hepatic

## 2012-07-05 NOTE — Progress Notes (Signed)
Quick Note:  Pt informed on cell VM ______ 

## 2012-07-07 ENCOUNTER — Emergency Department (HOSPITAL_COMMUNITY)
Admission: EM | Admit: 2012-07-07 | Discharge: 2012-07-07 | Disposition: A | Discharge status: Other Acute Inpatient Hospital | Payer: PRIVATE HEALTH INSURANCE | Attending: Emergency Medicine | Admitting: Emergency Medicine

## 2012-07-07 ENCOUNTER — Telehealth: Payer: Self-pay | Admitting: Family Medicine

## 2012-07-07 ENCOUNTER — Encounter (HOSPITAL_COMMUNITY): Payer: Self-pay | Admitting: *Deleted

## 2012-07-07 ENCOUNTER — Inpatient Hospital Stay (HOSPITAL_COMMUNITY)
Admission: AD | Admit: 2012-07-07 | Discharge: 2012-07-11 | DRG: 882 | Disposition: A | Discharge status: Home / Self Care | Payer: PRIVATE HEALTH INSURANCE | Source: Ambulatory Visit | Attending: Psychiatry | Admitting: Psychiatry

## 2012-07-07 DIAGNOSIS — F17200 Nicotine dependence, unspecified, uncomplicated: Secondary | ICD-10-CM | POA: Insufficient documentation

## 2012-07-07 DIAGNOSIS — I10 Essential (primary) hypertension: Secondary | ICD-10-CM | POA: Insufficient documentation

## 2012-07-07 DIAGNOSIS — F329 Major depressive disorder, single episode, unspecified: Secondary | ICD-10-CM | POA: Insufficient documentation

## 2012-07-07 DIAGNOSIS — E079 Disorder of thyroid, unspecified: Secondary | ICD-10-CM | POA: Diagnosis present

## 2012-07-07 DIAGNOSIS — Z880 Allergy status to penicillin: Secondary | ICD-10-CM

## 2012-07-07 DIAGNOSIS — I499 Cardiac arrhythmia, unspecified: Secondary | ICD-10-CM | POA: Diagnosis present

## 2012-07-07 DIAGNOSIS — Z881 Allergy status to other antibiotic agents status: Secondary | ICD-10-CM

## 2012-07-07 DIAGNOSIS — R45851 Suicidal ideations: Secondary | ICD-10-CM

## 2012-07-07 DIAGNOSIS — Z79899 Other long term (current) drug therapy: Secondary | ICD-10-CM | POA: Insufficient documentation

## 2012-07-07 DIAGNOSIS — F3289 Other specified depressive episodes: Secondary | ICD-10-CM | POA: Insufficient documentation

## 2012-07-07 DIAGNOSIS — F4310 Post-traumatic stress disorder, unspecified: Principal | ICD-10-CM | POA: Diagnosis present

## 2012-07-07 DIAGNOSIS — Z9109 Other allergy status, other than to drugs and biological substances: Secondary | ICD-10-CM

## 2012-07-07 DIAGNOSIS — K219 Gastro-esophageal reflux disease without esophagitis: Secondary | ICD-10-CM | POA: Diagnosis present

## 2012-07-07 DIAGNOSIS — F411 Generalized anxiety disorder: Secondary | ICD-10-CM | POA: Diagnosis present

## 2012-07-07 DIAGNOSIS — E785 Hyperlipidemia, unspecified: Secondary | ICD-10-CM | POA: Diagnosis present

## 2012-07-07 DIAGNOSIS — F410 Panic disorder [episodic paroxysmal anxiety] without agoraphobia: Secondary | ICD-10-CM | POA: Diagnosis present

## 2012-07-07 LAB — CBC WITH DIFFERENTIAL/PLATELET
Basophils Absolute: 0 10*3/uL (ref 0.0–0.1)
Lymphocytes Relative: 30 % (ref 12–46)
Neutro Abs: 4.6 10*3/uL (ref 1.7–7.7)
Platelets: 259 10*3/uL (ref 150–400)
RDW: 11.6 % (ref 11.5–15.5)
WBC: 7.4 10*3/uL (ref 4.0–10.5)

## 2012-07-07 LAB — COMPREHENSIVE METABOLIC PANEL
ALT: 23 U/L (ref 0–35)
AST: 24 U/L (ref 0–37)
Albumin: 4.1 g/dL (ref 3.5–5.2)
CO2: 25 mEq/L (ref 19–32)
Calcium: 9.8 mg/dL (ref 8.4–10.5)
Chloride: 105 mEq/L (ref 96–112)
GFR calc non Af Amer: >90 mL/min (ref >90)
Sodium: 141 mEq/L (ref 135–145)
Total Bilirubin: 0.3 mg/dL (ref 0.3–1.2)

## 2012-07-07 LAB — RAPID URINE DRUG SCREEN, HOSP PERFORMED
Barbiturates: NOT DETECTED
Benzodiazepines: NOT DETECTED
Tetrahydrocannabinol: NOT DETECTED

## 2012-07-07 LAB — ETHANOL: Alcohol, Ethyl (B): <11 mg/dL (ref 0–11)

## 2012-07-07 MED ORDER — ALUM & MAG HYDROXIDE-SIMETH 200-200-20 MG/5ML PO SUSP: 30.0000 mL | ORAL | Status: DC | PRN

## 2012-07-07 MED ORDER — PANTOPRAZOLE SODIUM 40 MG PO TBEC
40.0000 mg | DELAYED_RELEASE_TABLET | Freq: Every day | ORAL | Status: DC
  Administered 2012-07-08 – 2012-07-11 (×4): 40 mg via ORAL
  Filled 2012-07-07 (×5): qty 1

## 2012-07-07 MED ORDER — LEVOTHYROXINE SODIUM 112 MCG PO TABS
112.0000 ug | ORAL_TABLET | Freq: Every day | ORAL | Status: DC
  Administered 2012-07-08 – 2012-07-11 (×4): 112 ug via ORAL
  Filled 2012-07-07 (×7): qty 1

## 2012-07-07 MED ORDER — MAGNESIUM HYDROXIDE 400 MG/5ML PO SUSP: 30.0000 mL | Freq: Every day | ORAL | Status: DC | PRN

## 2012-07-07 MED ORDER — ACETAMINOPHEN 325 MG PO TABS
650.0000 mg | ORAL_TABLET | Freq: Four times a day (QID) | ORAL | Status: DC | PRN
  Administered 2012-07-07: 650 mg via ORAL
  Filled 2012-07-07: qty 2

## 2012-07-07 MED ORDER — ATORVASTATIN CALCIUM 20 MG PO TABS
20.0000 mg | ORAL_TABLET | Freq: Every day | ORAL | Status: DC
  Administered 2012-07-08 – 2012-07-10 (×3): 20 mg via ORAL
  Filled 2012-07-07 (×7): qty 1

## 2012-07-07 MED ORDER — QUETIAPINE FUMARATE 50 MG PO TABS: 100.0000 mg | ORAL_TABLET | Freq: Every day | ORAL | Status: DC

## 2012-07-07 MED ORDER — DIAZEPAM 5 MG PO TABS
5.0000 mg | ORAL_TABLET | Freq: Once | ORAL | Status: AC
  Administered 2012-07-07: 5 mg via ORAL
  Filled 2012-07-07: qty 1

## 2012-07-07 MED ORDER — QUETIAPINE FUMARATE 100 MG PO TABS
100.0000 mg | ORAL_TABLET | Freq: Every day | ORAL | Status: DC
  Administered 2012-07-07: 100 mg via ORAL
  Filled 2012-07-07 (×4): qty 1

## 2012-07-07 MED ORDER — NICOTINE 14 MG/24HR TD PT24
14.0000 mg | MEDICATED_PATCH | Freq: Every day | TRANSDERMAL | Status: DC
  Filled 2012-07-07: qty 1

## 2012-07-07 MED ORDER — ACETAMINOPHEN 325 MG PO TABS: 650.0000 mg | ORAL_TABLET | Freq: Four times a day (QID) | ORAL | Status: DC | PRN

## 2012-07-07 MED ORDER — ATORVASTATIN CALCIUM 20 MG PO TABS
20.0000 mg | ORAL_TABLET | Freq: Every day | ORAL | Status: DC
  Filled 2012-07-07: qty 1

## 2012-07-07 MED ORDER — LEVOTHYROXINE SODIUM 112 MCG PO TABS
112.0000 ug | ORAL_TABLET | Freq: Every day | ORAL | Status: DC
  Filled 2012-07-07 (×2): qty 1

## 2012-07-07 MED ORDER — MIRTAZAPINE 15 MG PO TBDP
15.0000 mg | ORAL_TABLET | Freq: Every day | ORAL | Status: DC
  Administered 2012-07-07: 15 mg via ORAL
  Filled 2012-07-07 (×4): qty 1
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
## 2012-07-07 NOTE — ED Notes (Signed)
Patient remains in view of desk and nursing staff.  Patient very tearful at present.
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
## 2012-07-07 NOTE — Progress Notes (Signed)
D: Patient denies SI/HI/AVH. Patient rates hopelessness as 5 and depression as 7.  Patient affect is flat. Mood is depressed.  Pt states that she is depressed because her husband did not come to see her tonight and he is her sole support.  She states that she hopes that he comes tomorrow. Patient did attend evening group. Patient visible on the milieu. No distress noted. A: Support and encouragement offered. Scheduled medications given to pt. Q 15 min checks continued for patient safety. R: Patient receptive. Patient remains safe on the unit.
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
## 2012-07-07 NOTE — ED Notes (Signed)
MD ok's to eat.  Husband remains at bedside.  Patient remains in nursing view.

## 2012-07-07 NOTE — ED Notes (Signed)
Called Adult unit to give report. Receiving RN unable to take report at this time.
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
## 2012-07-07 NOTE — ED Notes (Signed)
Husband name Chrissie Noa [phone]
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
## 2012-07-07 NOTE — BH Assessment (Signed)
Assessment Note  Update:  Received call from [facility] stating pt accepted Dr. Catha Brow to Dr. Dan Humphreys to bed [phone] and that pt could be transported.  EDP Preston Fleeting and ED staff updated.  Completed support paperwork.  Completed assessment notification, updated assessment notification and faxed to [facility] to log.  ED staff to arrange transport to [facility] as pt is voluntary.     Disposition:  Disposition Disposition of Patient: Inpatient treatment program Type of inpatient treatment program: Adult Patient referred to: Other (Comment) (Pt accepted [facility])  On Site Evaluation by:   Reviewed with Physician:  Mitchell Heir, Rennis Harding 07/07/2012 3:40 PM

## 2012-07-07 NOTE — ED Provider Notes (Signed)
Patient has been accepted at [facility] by Dr. walker.  Dione Booze, MD 07/07/12 1539

## 2012-07-08 DIAGNOSIS — F4310 Post-traumatic stress disorder, unspecified: Principal | ICD-10-CM

## 2012-07-08 MED ORDER — NICOTINE 14 MG/24HR TD PT24
14.0000 mg | MEDICATED_PATCH | Freq: Every day | TRANSDERMAL | Status: DC
  Administered 2012-07-08 – 2012-07-11 (×4): 14 mg via TRANSDERMAL
  Filled 2012-07-08 (×6): qty 1

## 2012-07-08 MED ORDER — PRAZOSIN HCL 1 MG PO CAPS
1.0000 mg | ORAL_CAPSULE | Freq: Every evening | ORAL | Status: DC | PRN
  Administered 2012-07-08 – 2012-07-10 (×3): 1 mg via ORAL
  Filled 2012-07-08 (×2): qty 1
  Filled 2012-07-08: qty 28
  Filled 2012-07-08 (×4): qty 1
  Filled 2012-07-08: qty 28
  Filled 2012-07-08 (×2): qty 1

## 2012-07-08 MED ORDER — OMEGA-3-ACID ETHYL ESTERS 1 G PO CAPS
1.0000 g | ORAL_CAPSULE | Freq: Two times a day (BID) | ORAL | Status: DC
  Administered 2012-07-08 – 2012-07-11 (×7): 1 g via ORAL
  Filled 2012-07-08 (×10): qty 1

## 2012-07-08 MED ORDER — RISPERIDONE 0.5 MG PO TABS
0.5000 mg | ORAL_TABLET | Freq: Every day | ORAL | Status: DC
  Administered 2012-07-08 – 2012-07-10 (×3): 0.5 mg via ORAL
  Filled 2012-07-08 (×5): qty 1

## 2012-07-08 MED ORDER — RISPERIDONE 0.25 MG PO TABS
0.2500 mg | ORAL_TABLET | Freq: Two times a day (BID) | ORAL | Status: DC
  Administered 2012-07-08 – 2012-07-11 (×7): 0.25 mg via ORAL
  Filled 2012-07-08: qty 1
  Filled 2012-07-08: qty 56
  Filled 2012-07-08 (×7): qty 1
  Filled 2012-07-08: qty 56

## 2012-07-08 MED ORDER — ENSURE COMPLETE PO LIQD
237.0000 mL | Freq: Every day | ORAL | Status: DC
  Administered 2012-07-08 – 2012-07-10 (×3): 237 mL via ORAL

## 2012-07-08 NOTE — H&P (Signed)
Medical/psychiatric screening examination/treatment/procedure(s) were performed by non-physician practitioner and as supervising physician I was immediately available for consultation/collaboration.  I have seen and examined this patient and agree with the major elements of this evaluation.  

## 2012-07-08 NOTE — Progress Notes (Signed)
Psychoeducational Group Note  Date:  07/08/2012 Time:  1030  Group Topic/Focus:  Relapse Prevention Planning:   The focus of this group is to define relapse and discuss the need for planning to combat relapse.  Participation Level:  Active  Participation Quality:  Appropriate and Attentive  Affect:  Appropriate  Cognitive:  Alert, Appropriate and Oriented  Insight:  Good  Engagement in Group:  Good  Additional Comments:  Pt. Attended and participated in making a recovery plan   Meredith Staggers 07/08/2012, 11:25 AM

## 2012-07-08 NOTE — Progress Notes (Signed)
07/08/2012      Time: 1500      Group Topic/Focus: The focus of this group is on discussing various styles of communication and communicating assertively using 'I' (feeling) statements.  Participation Level: Active  Participation Quality: Sharing  Affect: Appropriate  Cognitive: Appropriate   Additional Comments: Patient late to group, open about communication issues she has had with her siblings and how she rekindled a relationship with her brother, but not her sisters.   Tara Cameron 07/08/2012 4:00 PM
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
## 2012-07-08 NOTE — Progress Notes (Signed)
Nutrition Brief Note  Patient identified on the Malnutrition Screening Tool (MST) report for unintended weight loss and poor appetite, generating a score of 2.   Body mass index is 26.57 kg/(m^2). Pt meets criteria for overweight based on current BMI.   - Met with pt who reports poor appetite for the past 6 weeks r/t being on Lipitor and poor appetite a month before that r/t anxiety. Pt reports typically eating 2 meals/day of lots of vegetables and protein which she thinks will be useful for her since she is starting Risperdal today. Pt reports her weight has been up and down. Pt agreeable to trying Ensure at night. Pt without any nutrition educational needs.   Wt Readings from Last 10 Encounters:  07/07/12 150 lb (68.04 kg)  07/04/12 152 lb (68.947 kg)  06/24/12 150 lb (68.04 kg)  06/17/12 149 lb (67.586 kg)  05/13/12 154 lb (69.854 kg)  05/05/12 151 lb 3.8 oz (68.6 kg)  04/18/12 156 lb (70.761 kg)  09/16/11 154 lb (69.854 kg)  09/04/11 153 lb (69.4 kg)  07/28/11 150 lb (68.04 kg)    No nutrition interventions warranted at this time. If nutrition issues arise, please consult RD.   Levon Hedger MS, RD, LDN [phone] Pager [phone] After Hours Pager

## 2012-07-08 NOTE — Progress Notes (Signed)
  D) Patient pleasant and cooperative upon my assessment. Patient states slept "well," and  appetite is "improving." Patient rates depression as   7/10, patient rates hopeless feelings as  5/10. Patient denies SI/HI, denies A/V hallucinations.   A) Patient offered support and encouragement, patient encouraged to discuss feelings/concerns with staff. Patient verbalized understanding. Patient monitored Q15 minutes for safety. Patient met with MD and treatment team to discuss today's goals and plan of care.  R) Patient active on unit, attending groups in day room and meals in dining room.  Patient has a plan to "try to focus on my life and take more time for myself" once she is discharged from [facility]. Patient taking medications as ordered. Will continue to monitor.

## 2012-07-08 NOTE — Progress Notes (Signed)
[facility] Group Notes: (Counselor/Nursing/MHT/Case Management/Adjunct) 07/08/2012   @1 :15pm Breathing & Meditation for Anxiety/Anger   Type of Therapy:  Group Therapy  Participation Level:  Limited  Participation Quality: Attentive    Affect:  Flat  Cognitive:  Appropriate  Insight:  None  Engagement in Group: Minimal  Engagement in Therapy:  Minimal  Modes of Intervention:  Support and Exploration  Summary of Progress/Problems: Sincerity participated in relaxation techniques - focused breathing and progressive muscle relaxation. She did not verbally process her experience, but seemed to relate well to the positive statements of others. She was very engaged in discussion of alternative therapies and grounding skills related to PTSD  Angus Palms, LCSW 07/08/2012  3:49 PM
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
## 2012-07-09 DIAGNOSIS — F411 Generalized anxiety disorder: Secondary | ICD-10-CM

## 2012-07-09 MED ORDER — FLUOXETINE HCL 10 MG PO CAPS
10.0000 mg | ORAL_CAPSULE | Freq: Every day | ORAL | Status: DC
  Administered 2012-07-09 – 2012-07-11 (×3): 10 mg via ORAL
  Filled 2012-07-09 (×3): qty 1
  Filled 2012-07-09: qty 14
  Filled 2012-07-09: qty 1

## 2012-07-09 MED ORDER — HYDROXYZINE HCL 25 MG PO TABS
25.0000 mg | ORAL_TABLET | Freq: Three times a day (TID) | ORAL | Status: DC | PRN
  Administered 2012-07-09 – 2012-07-10 (×2): 25 mg via ORAL

## 2012-07-09 NOTE — Progress Notes (Signed)
D) Pt rates her depression and hopelessness both at a 2. Reports that she has SI on and off throughout the day. Has attended the groups and participates. States that she is learning a lot. Continues to have some flashback. Affect is flat and mood depressed. A) Given support, reassurance and praise. Encouraged to work on her workbook. 1:1 provided to Pt. R) Continues to have thoughts of SI on and off throughout the day. Contracts for her safety.

## 2012-07-09 NOTE — Progress Notes (Signed)
Patient ID: Tara Cameron, female   DOB: July 11, 1961, 51 y.o.   MRN: [id number]   [facility] Group Notes:  (Counselor/Nursing/MHT/Case Management/Adjunct)  07/09/2012 1:15 PM  Type of Therapy:  Group Therapy  Participation Level:  Active  Participation Quality:  Appropriate  Affect:  Appropriate  Cognitive:  Appropriate  Insight:  Good  Engagement in Group:  Good  Engagement in Therapy:  Good  Modes of Intervention:  Clarification, Problem-solving, Role-play, Socialization and Support  Summary of Progress/Problems: Therapist and group members discussed that sometimes we act like super-heroes and we take on more than we can handle. Group members discussed asking for help and how to support themselves. Group members practiced positive self-affirmations and were encouraged to make a list of things they like about themselves. Pt did not share much vocally but she was very attentive and nodded her head in understanding and agreement. Pt shared positive affirmation- "I'm a good person."     Cassidi Long 07/09/2012. 3:07 PM

## 2012-07-09 NOTE — Progress Notes (Signed)
Patient ID: Tara Cameron, female   DOB: 1961-02-25, 51 y.o.   MRN: [id number]  Pt. attended and participated in aftercare planning group. Pt. accepted information on suicide prevention, warning signs to look for with suicide and crisis line numbers to use. Pt. listed their current anxiety level as 2 and their current depression level as 0. Pt does not have any S/I or H/I.

## 2012-07-09 NOTE — Progress Notes (Addendum)
[facility] Adult Inpatient Family/Significant Other Suicide Prevention Education  Suicide Prevention Education:  Contact Attempts: Chrissie Noa Perrone-[phone]-Pt.'s husband- has been identified by the patient as the family member/significant other with whom the patient will be residing, and identified as the person(s) who will aid the patient in the event of a mental health crisis.  With written consent from the patient, two attempts were made to provide suicide prevention education, prior to and/or following the patient's discharge.  We were unsuccessful in providing suicide prevention education.  A suicide education pamphlet was given to the patient to share with family/significant other.  Date and time of first attempt: by Lamar Blinks on 07/09/12 at 5:25pm.-Left message Date and time of second attempt: by Lamar Blinks on 07/10/12 at 3:34pm-Left Message Date and Time of third attempt: By Lamar Blinks on 07/10/12 at 4:51 pm-Left message Neila Gear 07/09/2012, 5:36 PM
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
## 2012-07-09 NOTE — Progress Notes (Signed)
D: Pt reports having depression rated at 4/10. Pt attended group this evening with participation. Pt is pleased that Dr. Dan Humphreys has pinpointed her diagnosis of PTSD. She feels that the diagnosis of PTSD describes her situation perfectly. Pt is denying any SI/HI. She feels that her medications are working. Pt has been observed interacting approprietly within the milieu. A: Continued support and availability as needed was extended to this pt. Pt medications were administered with the indications disclosed. Pt was updated on not being a weekend discharge at his time. q53min checks in continuation for this pt.  R: Pt receptive to information given. Pt remains safe at this time.

## 2012-07-10 MED ORDER — HYDROCORTISONE 1 % EX CREA
TOPICAL_CREAM | Freq: Two times a day (BID) | CUTANEOUS | Status: DC
  Administered 2012-07-10 – 2012-07-11 (×2): 1 via TOPICAL
  Filled 2012-07-10: qty 1.5

## 2012-07-10 NOTE — Progress Notes (Signed)
The focus of this group is to help patients review their daily goal of treatment and discuss progress on daily workbooks. Pt stated that her treatment goal was to get back to feeling like herself, which she last felt like about a year ago.

## 2012-07-10 NOTE — Progress Notes (Signed)
Patient ID: Tara Cameron, female   DOB: [birth year], 51 y.o.   MRN: [id number]  Pt. attended and participated in aftercare planning group. Pt. accepted information on suicide prevention, warning signs to look for with suicide and crisis line numbers to use. Pt. listed their current anxiety level as 2 and their current depression level as 1. Pt currently has no S/I or H/I.

## 2012-07-10 NOTE — Progress Notes (Signed)
[facility] Adult Inpatient Family/Significant Other Suicide Prevention Education  Suicide Prevention Education:  Education Completed; Tara Cameron Bar-[phone]-husband- has been identified by the patient as the family member/significant other with whom the patient will be residing, and identified as the person(s) who will aid the patient in the event of a mental health crisis (suicidal ideations/suicide attempt).  With written consent from the patient, the family member/significant other has been provided the following suicide prevention education, prior to the and/or following the discharge of the patient.  The suicide prevention education provided includes the following:  Suicide risk factors  Suicide prevention and interventions  National Suicide Hotline telephone number  [facility] assessment telephone number  [facility] Emergency Assistance 911  [facility] and/or Residential Mobile Crisis Unit telephone number  Request made of family/significant other to:  Remove weapons (e.g., guns, rifles, knives), all items previously/currently identified as safety concern.  Pt.'s husband states that there are no guns or weapons in the home.  Remove drugs/medications (over-the-counter, prescriptions, illicit drugs), all items previously/currently identified as a safety concern. Pt.'s husband states that he has no concerns about medication.  Pt.'s husband states the pt. Has not had any prior SI attempts or problems with SI thoughts. Pt.'s husband states that he feels pt. Should stay until appointment date for follow up. But otherwise the pt.'s husband had no concerns and feels she is better since coming to [facility]. Pt.'s husband can be reached at the number above.  The family member/significant other verbalizes understanding of the suicide prevention education information provided.  The family member/significant other agrees to remove the items of safety concern listed  above.  Lamar Blinks [place] 07/10/2012, 5:27 PM

## 2012-07-10 NOTE — Progress Notes (Signed)
D) Pt has attended the program and interacts with select peers. Denies SI and HI. Rates her depression and hopelessness both at a 1. Pt states that she is learning new skills to help her cope. Pt admitted in groups that she wanted to committee suicide but then said that she had blacked out and was not aware of what she had done. Talked about several stressors, one of which was a sister, who she helped raise. States that she and then sister have not been in contact for the last five years and that it is a painful fact of her.  A) given support, reassurance and praise. Encouraged to look at her issues and to be open with the people who are in her life and to share her feelings with them. R) Denies SI and HI. States that she is feeling better.

## 2012-07-10 NOTE — Progress Notes (Signed)
Patient ID: Tara Cameron, female   DOB: 19-Aug-1961, 51 y.o.   MRN: [id number] D)  Has been pleasant, attended group, interacting appropriatelyl with select peers and staff.  Has been rather quiet but observant and polite, supportive of peers, feels she is learning and benefitting from group..  Affect is rather flat and sad Has been working on homework, med compliant . A)  Encourage her progress, offer support, continue to try to develop therapeutic rapport, continue to monitor for safety.Marland Kitchen R)  Receptive, appreciative.

## 2012-07-10 NOTE — Progress Notes (Signed)
Psychoeducational Group Note  Date:  07/10/2012 Time:  1515  Group Topic/Focus:  Crisis Planning:   The purpose of this group is to help patients create a crisis plan for use upon discharge or in the future, as needed.  Participation Level:  Active  Participation Quality:  Appropriate, Sharing and Supportive  Affect:  Appropriate  Cognitive:  Appropriate  Insight:  Good  Engagement in Group:  Good  Additional Comments:  none  Sheamus Hasting, Genia Plants 07/10/2012, 5:28 PM
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
## 2012-07-10 NOTE — Progress Notes (Signed)
Patient ID: Tara Cameron, female   DOB: [birth year], 51 y.o.   MRN: [id number]   [facility] Group Notes:  (Counselor/Nursing/MHT/Case Management/Adjunct)  07/10/2012 1:15 PM  Type of Therapy:  Group Therapy, Dance/Movement Therapy   Participation Level:  Active  Participation Quality:  Appropriate  Affect:  Appropriate  Cognitive:  Appropriate  Insight:  Good  Engagement in Group:  Good  Engagement in Therapy:  Good  Modes of Intervention:  Clarification, Problem-solving, Role-play, Socialization and Support  Summary of Progress/Problems: Therapist and group members discussed healthy support systems. Group members shared ideas of what they need in a support and the group went over a list of positive characteristics of someone who would be a good support. Group members shared their ideas and experiences of times when people were not very supportive. Pt shared about her family experiences and how it is important to have someone who she can trust.     Cassidi Long 07/10/2012. 3:24 PM

## 2012-07-11 ENCOUNTER — Telehealth: Payer: Self-pay | Admitting: Family Medicine

## 2012-07-11 MED ORDER — FLUOXETINE HCL 10 MG PO CAPS: 10.0000 mg | ORAL_CAPSULE | Freq: Every day | ORAL | Status: DC

## 2012-07-11 MED ORDER — PRAZOSIN HCL 1 MG PO CAPS: 1.0000 mg | ORAL_CAPSULE | Freq: Every evening | ORAL | Status: DC | PRN

## 2012-07-11 MED ORDER — LEVOTHYROXINE SODIUM 112 MCG PO TABS: 112.0000 ug | ORAL_TABLET | Freq: Every day | ORAL | Status: DC

## 2012-07-11 MED ORDER — OMEPRAZOLE 20 MG PO CPDR: 20.0000 mg | DELAYED_RELEASE_CAPSULE | Freq: Every day | ORAL | Status: DC

## 2012-07-11 MED ORDER — RISPERIDONE 0.25 MG PO TABS: ORAL_TABLET | ORAL | Status: DC

## 2012-07-11 MED ORDER — ATORVASTATIN CALCIUM 20 MG PO TABS: 20.0000 mg | ORAL_TABLET | Freq: Every day | ORAL | Status: DC
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
## 2012-07-11 NOTE — Progress Notes (Signed)
Patient ID: Tara Cameron, female   DOB: December 24, 1960, 51 y.o.   MRN: [id number] In better spirits this evening, affect still flat, but states is feeling better, and just finally having an accurate diagnosis has made her feel better.  States she can deal with her past, states she had already made an appt for Tuesday with [facility], and is hoping for discharge Monday so that she can begin to follow with them.  Attended group, med compliant, interacting well with staff and select peers, and denies thoughts of self harm.  Will continue to monitor q 15 minutes for safety.

## 2012-07-11 NOTE — Tx Team (Signed)
Interdisciplinary Treatment Plan Update (Adult)  Date:  07/11/2012  Time Reviewed:  10:35 AM   Progress in Treatment: Attending groups: Yes Participating in groups:  Yes Taking medication as prescribed: Yes Tolerating medication:  Yes Family/Significant other contact made: Yes Patient understands diagnosis:  Yes Discussing patient identified problems/goals with staff:  Yes Medical problems stabilized or resolved:  Yes Denies suicidal/homicidal ideation: Yes Issues/concerns per patient self-inventory:  None identified Other: N/A  New problem(s) identified: None Identified  Reason for Continuation of Hospitalization: Stable to d/c  Interventions implemented related to continuation of hospitalization: Stable to d/c  Additional comments: N/A  Estimated length of stay: D/C today  Discharge Plan: Pt will follow up with [facility] for medication management and therapy.    New goal(s): N/A  Review of initial/current patient goals per problem list:    1.  Goal(s): Reduce depressive symptoms  Met:  Yes  Target date: by discharge  As evidenced by: Reducing depression from a 10 to a 3 as reported by pt.  Pt rates at a 0 today.   2.  Goal (s): Reduce/Eliminate suicidal ideation  Met:  Yes  Target date: by discharge  As evidenced by: pt reporting no SI.    3.  Goal(s): Reduce anxiety symptoms  Met:  Yes  Target date: by discharge  As evidenced by: Reduce anxiety from a 10 to a 3 as reported by pt. Pt rates at a 0 today.     Attendees: Patient:  Tara Cameron  07/11/2012 10:35 AM   Family:     Physician:  Orson Aloe, MD 07/11/2012 10:35 AM   Nursing:   Quintella Reichert, RN 07/11/2012 10:35 AM   Case Manager:  Reyes Ivan, LCSWA 07/11/2012 10:35 AM   Counselor:  Angus Palms, LCSW 07/11/2012 10:35 AM   Other:  Chinita Greenland, RN 07/11/2012 10:35 AM   Other: Serena Colonel, NP 07/11/2012 10:35 AM   Other:     Other:      Scribe for Treatment Team:     Carmina Miller, 07/11/2012 10:35 AM

## 2012-07-11 NOTE — Progress Notes (Signed)
Patient ID: Tara Cameron, female   DOB: [birth year], 51 y.o.   MRN: [id number] Patient has order for d/c. Denies SI/HI. She verbalizes understanding of follow up and medications. Patient received samples of her medications. Mood appeared stable at time of d/c. She was picked up by her husband.

## 2012-07-11 NOTE — Progress Notes (Signed)
[facility] Case Management Discharge Plan:  Will you be returning to the same living situation after discharge: Yes,  return to own home At discharge, do you have transportation home?:Yes,  access to transportation Do you have the ability to pay for your medications:Yes,  access to meds  Interagency Information:     Release of information consent forms completed and in the chart;  Patient's signature needed at discharge.  Patient to Follow up at:  Follow-up Information    Follow up with Dr.Poulos -[facility]. On 07/12/2012. (Patient is scheduled to with Dr.Poulos on Tuesday, September 17,2013 at 10:15am)    Contact information:   [address] [place], [place] [address]  [phone]         Patient denies SI/HI:   Yes,  denies SI/HI    Safety Planning and Suicide Prevention discussed:  Yes,  discussed with pt today  Barrier to discharge identified:No.  Summary and Recommendations: Pt attended discharge planning group and actively participated in group.  SW provided pt with today's workbook.  Pt presents with calm mood and affect.  Pt rates depression and anxiety at a 0 today.  Pt denies SI/HI.  Pt reports feeling stable to d/c today.  No recommendations from SW.  No further needs voiced by pt.  Pt stable to discharge.     Carmina Miller 07/11/2012, 10:37 AM
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
## 2012-07-11 NOTE — Telephone Encounter (Signed)
Called and left message.

## 2012-07-11 NOTE — Telephone Encounter (Signed)
Caller: William/Patient; Phone: [phone]; Reason for Call: Patient is in the hospital and he called Thursday to follow up on his wife.

## 2012-07-11 NOTE — Progress Notes (Signed)
Psychoeducational Group Note  Date:  07/11/2012 Time:  1:15  Group Topic/Focus:  Obstacles to Wellness  Participation Level:  Active  Participation Quality:  Appropriate  Affect:  Expressed anger toward boss, but affect was appropriate and calm  Cognitive:  Alert  Insight:  Good  Engagement in Group:  Good  Additional Comments:  Tara Cameron primarily discussed frustrations caused by her boss, whom she described as critical and demanding. Tara Cameron seemed receptive to group leaders' and members' suggestions on how to deal with her boss, for example, by realizing that her boss may be going through a tough time and by understanding that her work is of high quality regardless of how the boss views it.  [name] S 07/11/2012, 3:03 PM

## 2012-07-12 NOTE — Progress Notes (Signed)
Patient Discharge Instructions:  After Visit Summary (AVS):   Faxed to:  07/12/2012 Psychiatric Admission Assessment Note:   Faxed to:  07/12/2012 Suicide Risk Assessment - Discharge Assessment:   Faxed to:  07/12/2012 Faxed/Sent to the Next Level Care provider:  07/12/2012  Faxed to [facility] - Dr. Lance Coon @ [phone]  Heloise Purpura, Eduard Clos, 07/12/2012, 4:51 PM

## 2012-07-16 NOTE — Discharge Summary (Signed)
I agree with this D/C Summary.  

## 2013-01-25 ENCOUNTER — Encounter: Payer: Self-pay | Admitting: Family Medicine

## 2013-01-25 ENCOUNTER — Ambulatory Visit (INDEPENDENT_AMBULATORY_CARE_PROVIDER_SITE_OTHER): Payer: PRIVATE HEALTH INSURANCE | Admitting: Family Medicine

## 2013-01-25 VITALS — BP 120/82 | Temp 98.9°F | Wt 167.0 lb

## 2013-01-25 DIAGNOSIS — E039 Hypothyroidism, unspecified: Secondary | ICD-10-CM

## 2013-01-25 DIAGNOSIS — R071 Chest pain on breathing: Secondary | ICD-10-CM

## 2013-01-25 DIAGNOSIS — R0789 Other chest pain: Secondary | ICD-10-CM

## 2013-01-25 DIAGNOSIS — R911 Solitary pulmonary nodule: Secondary | ICD-10-CM

## 2013-01-25 DIAGNOSIS — E785 Hyperlipidemia, unspecified: Secondary | ICD-10-CM

## 2013-01-25 LAB — HEPATIC FUNCTION PANEL
ALT: 25 U/L (ref 0–35)
Albumin: 4.1 g/dL (ref 3.5–5.2)
Alkaline Phosphatase: 83 U/L (ref 39–117)
Bilirubin, Direct: 0.1 mg/dL (ref 0.0–0.3)
Total Protein: 7.4 g/dL (ref 6.0–8.3)

## 2013-01-25 LAB — LIPID PANEL
Cholesterol: 150 mg/dL (ref 0–200)
LDL Cholesterol: 90 mg/dL (ref 0–99)
Triglycerides: 74 mg/dL (ref 0.0–149.0)
VLDL: 14.8 mg/dL (ref 0.0–40.0)

## 2013-01-25 NOTE — Progress Notes (Signed)
  Subjective:    Patient ID: Tara Cameron, female    DOB: 01/27/1961, 52 y.o.   MRN: [id number]  HPI Medical followup  Hyperlipidemia treated with Lipitor. No myalgias. Compliant with therapy. No history of peripheral vascular disease or CAD. No family history of premature CAD.  Hypothyroidism treated with levothyroxin. Compliant with therapy. Patient had bout of extreme depression and had suicidal ideation last fall. She was admitted and is currently stable on regimen of Prozac, trazodone, and lorazepam We initially had some concern about bipolar disorder in addition of posttraumatic stress disorder. Bipolar was apparently ruled out.  Patient complains of right chest wall pain. Onset 2 weeks ago. She was at [facility] and not down by a wave. Pain location is right lateral and anterior chest wall No bruising.  Patient trying to quit smoking. Currently using electronic cigarettes. Right middle lobe 3 mm nodule by CT scan last summer. Needs to have followup CT chest scheduled for this July. She denies cough, appetite change, weight change, or any hemoptysis.  Past Medical History  Diagnosis Date  . Depression   . GERD (gastroesophageal reflux disease)   . Allergy   . Arrhythmia   . Cardiac murmur   . Hypertension   . Thyroid disease   . Panic attack   . Anxiety   . Hyperlipidemia   . Chest pain     normal myoview 04/2012  . Abnormal CXR     04/2012 - 9mm spiculated RML pulmonary nodule found on CXR, f/u chest CT noted correspondence with benign calcified granuloma, recommendations for f/u CT in one year  . Tobacco abuse    Past Surgical History  Procedure Laterality Date  . Tonsillectomy  1969  . Abdominal hysterectomy  1995  . Hysteroscopy  1994  . Carpal tunnel release      1988, 1994    reports that she has been smoking Cigarettes.  She has a 2.5 pack-year smoking history. She has never used smokeless tobacco. She reports that  drinks alcohol. She reports that she does  not use illicit drugs. family history includes Cancer in her father and mother and Heart attack (age of onset: 71) in her maternal grandfather. Allergies  Allergen Reactions  . Benzodiazepines Other (See Comments)    Had a suicide attempt that occurred while under the influence of benzodiazepines, which could have been disinhibition from Benzos  . Erythromycin     hives  . Iodine     hives  . Sulfa Antibiotics     hives  . Zithromax (Azithromycin Dihydrate)     hives      Review of Systems  Constitutional: Negative for fatigue and unexpected weight change.  Eyes: Negative for visual disturbance.  Respiratory: Negative for cough, chest tightness, shortness of breath and wheezing.   Cardiovascular: Negative for chest pain, palpitations and leg swelling.  Neurological: Negative for dizziness, seizures, syncope, weakness, light-headedness and headaches.  Psychiatric/Behavioral: Negative for suicidal ideas, dysphoric mood and agitation. The patient is not nervous/anxious.        Objective:   Physical Exam  Constitutional: She appears well-developed and well-nourished.  Cardiovascular: Normal rate and regular rhythm.   Pulmonary/Chest: Effort normal and breath sounds normal. No respiratory distress. She has no wheezes. She has no rales.  Tenderness right anterior chest wall  Psychiatric: She has a normal mood and affect. Her behavior is normal.          Assessment & Plan:

## 2013-01-26 NOTE — Progress Notes (Signed)
Quick Note:  Pt informed on home VM ______ 

## 2013-01-30 ENCOUNTER — Ambulatory Visit: Payer: Self-pay | Admitting: Family Medicine

## 2013-04-14 ENCOUNTER — Ambulatory Visit: Payer: Self-pay | Admitting: Family Medicine

## 2013-04-23 ENCOUNTER — Other Ambulatory Visit: Payer: Self-pay | Admitting: Family Medicine

## 2013-04-26 ENCOUNTER — Telehealth: Payer: Self-pay | Admitting: Family Medicine

## 2013-04-26 NOTE — Telephone Encounter (Signed)
Pt had called with left sided abdominal pain and distention. RN called back for triage and left vm.

## 2013-04-27 ENCOUNTER — Ambulatory Visit (INDEPENDENT_AMBULATORY_CARE_PROVIDER_SITE_OTHER): Payer: PRIVATE HEALTH INSURANCE | Admitting: Family Medicine

## 2013-04-27 ENCOUNTER — Encounter: Payer: Self-pay | Admitting: Family Medicine

## 2013-04-27 VITALS — BP 124/76 | HR 74 | Temp 98.3°F | Ht 65.0 in | Wt 171.0 lb

## 2013-04-27 DIAGNOSIS — Z1211 Encounter for screening for malignant neoplasm of colon: Secondary | ICD-10-CM

## 2013-04-27 DIAGNOSIS — K602 Anal fissure, unspecified: Secondary | ICD-10-CM

## 2013-04-27 DIAGNOSIS — R1032 Left lower quadrant pain: Secondary | ICD-10-CM

## 2013-04-27 LAB — POCT URINALYSIS DIPSTICK
Glucose, UA: NEGATIVE
Leukocytes, UA: NEGATIVE
Nitrite, UA: NEGATIVE
Protein, UA: NEGATIVE
Urobilinogen, UA: 0.2

## 2013-04-27 LAB — CBC WITH DIFFERENTIAL/PLATELET
Eosinophils Absolute: 0.2 10*3/uL (ref 0.0–0.7)
Lymphocytes Relative: 27.1 % (ref 12.0–46.0)
MCHC: 34.1 g/dL (ref 30.0–36.0)
MCV: 96.9 fl (ref 78.0–100.0)
Monocytes Absolute: 0.7 10*3/uL (ref 0.1–1.0)
Neutrophils Relative %: 60.4 % (ref 43.0–77.0)
Platelets: 249 10*3/uL (ref 150.0–400.0)
RBC: 4.03 Mil/uL (ref 3.87–5.11)
WBC: 7.4 10*3/uL (ref 4.5–10.5)

## 2013-04-27 MED ORDER — METRONIDAZOLE 500 MG PO TABS: 500.0000 mg | ORAL_TABLET | Freq: Three times a day (TID) | ORAL | Status: DC

## 2013-04-27 MED ORDER — CIPROFLOXACIN HCL 500 MG PO TABS: 500.0000 mg | ORAL_TABLET | Freq: Two times a day (BID) | ORAL | Status: DC

## 2013-04-27 NOTE — Patient Instructions (Addendum)
We will set up GI referral for colonoscopy. Follow up promptly for any increasing fever, recurrent bloody stools, or worsening pain.    Anal Fissure, Adult An anal fissure is a small tear or crack in the skin around the anus. Bleeding from a fissure usually stops on its own within a few minutes. However, bleeding will often reoccur with each bowel movement until the crack heals.  CAUSES   Passing large, hard stools.  Frequent diarrheal stools.  Constipation.  Inflammatory bowel disease (Crohn's disease or ulcerative colitis).  Infections.  Anal sex. SYMPTOMS   Small amounts of blood seen on your stools, on toilet paper, or in the toilet after a bowel movement.  Rectal bleeding.  Painful bowel movements.  Itching or irritation around the anus. DIAGNOSIS Your caregiver will examine the anal area. An anal fissure can usually be seen with careful inspection. A rectal exam may be performed and a short tube (anoscope) may be used to examine the anal canal. TREATMENT   You may be instructed to take fiber supplements. These supplements can soften your stool to help make bowel movements easier.  Sitz baths may be recommended to help heal the tear. Do not use soap in the sitz baths.  A medicated cream or ointment may be prescribed to lessen discomfort. HOME CARE INSTRUCTIONS   Maintain a diet high in fruits, whole grains, and vegetables. Avoid constipating foods like bananas and dairy products.  Take sitz baths as directed by your caregiver.  Drink enough fluids to keep your urine clear or pale yellow.  Only take over-the-counter or prescription medicines for pain, discomfort, or fever as directed by your caregiver. Do not take aspirin as this may increase bleeding.  Do not use ointments containing numbing medications (anesthetics) or hydrocortisone. They could slow healing. SEEK MEDICAL CARE IF:   Your fissure is not completely healed within 3 days.  You have further  bleeding.  You have a fever.  You have diarrhea mixed with blood.  You have pain.  Your problem is getting worse rather than better. MAKE SURE YOU:   Understand these instructions.  Will watch your condition.  Will get help right away if you are not doing well or get worse. Document Released: 10/12/2005 Document Revised: 01/04/2012 Document Reviewed: 03/29/2011 [facility] Patient Information 2014 [place], [place].

## 2013-04-27 NOTE — Progress Notes (Signed)
Subjective:    Patient ID: Tara Cameron, female    DOB: Apr 11, 1961, 52 y.o.   MRN: [id number]  HPI Acute visit Patient seen with bright red blood per rectum this past Monday with wiping. No other episodes either prior to then or since then. She's had some alternating constipation and diarrhea. Denies any appetite change. Actually had some recent weight gain. Symptoms of diffuse bloated feeling lower abdomen.  Onset this past "Sunday night of some left lower quadrant abdominal pain. Described as dull quality and 7/10 severity at worse Exacerbated by change of position. No alleviating factors. Denies any fevers or chills. She's had some urine urgency but no burning with urination. No hematuria.  Patient states her father had some type of colon disorder but she's not aware of any family history of colon cancer.  She is treated for hypothyroidism and depression and depression has been stable. She also sister with hyperlipidemia on Lipitor.  Past Medical History  Diagnosis Date  . Depression   . GERD (gastroesophageal reflux disease)   . Allergy   . Arrhythmia   . Cardiac murmur   . Hypertension   . Thyroid disease   . Panic attack   . Anxiety   . Hyperlipidemia   . Chest pain     normal myoview 04/2012  . Abnormal CXR     04/2012 - 9mm spiculated RML pulmonary nodule found on CXR, f/u chest CT noted correspondence with benign calcified granuloma, recommendations for f/u CT in one year  . Tobacco abuse    Past Surgical History  Procedure Laterality Date  . Tonsillectomy  1969  . Abdominal hysterectomy  1995  . Hysteroscopy  1994  . Carpal tunnel release      19" 88, 1994    reports that she has been smoking Cigarettes.  She has a 2.5 pack-year smoking history. She has never used smokeless tobacco. She reports that  drinks alcohol. She reports that she does not use illicit drugs. family history includes Cancer in her father and mother and Heart attack (age of onset: 37) in her  maternal grandfather. Allergies  Allergen Reactions  . Benzodiazepines Other (See Comments)    Had a suicide attempt that occurred while under the influence of benzodiazepines, which could have been disinhibition from Benzos  . Erythromycin     hives  . Iodine     hives  . Sulfa Antibiotics     hives  . Zithromax (Azithromycin Dihydrate)     hives      Review of Systems  Constitutional: Negative for fever, chills, appetite change and unexpected weight change.  Respiratory: Negative for cough and shortness of breath.   Cardiovascular: Negative for chest pain.  Gastrointestinal: Positive for abdominal pain, diarrhea, constipation and blood in stool. Negative for nausea, vomiting and rectal pain.  Genitourinary: Positive for frequency. Negative for hematuria and pelvic pain.  Musculoskeletal: Negative for back pain.  Neurological: Negative for dizziness and weakness.  Hematological: Negative for adenopathy. Does not bruise/bleed easily.       Objective:   Physical Exam  Constitutional: She appears well-developed and well-nourished.  Cardiovascular: Normal rate and regular rhythm.   Pulmonary/Chest: Effort normal and breath sounds normal. No respiratory distress. She has no wheezes. She has no rales.  Abdominal: Soft. Bowel sounds are normal. She exhibits no distension and no mass. There is no rebound and no guarding.  Patient has some mild tenderness left lower quadrant to deep palpation. No guarding or rebound. No masses  palpated  Genitourinary:  Rectal exam reveals very small annual fissure on 6:00 position with no active bleeding. Digital exam brown stool in rectal vault. Hemoccult negative. No rectal mass.          Assessment & Plan:  #1 hematochezia very likely due to small anal fissure. She only had some bright blood with wiping once. Suspect this was not related to her abdominal pain issue #2 left lower quadrant abdominal pain. Differential would include acute  diverticulitis. Nonacute abdomen. No fevers. Check CBC and urinalysis. Cover empirically with Cipro 500 mg twice a day and Flagyl 500 mg 3 times a day for 10 days. Consider CT abdomen and pelvis if symptoms worsen or persist. Patient needs screening colonoscopy and we will set up

## 2013-05-01 ENCOUNTER — Telehealth: Payer: Self-pay | Admitting: Family Medicine

## 2013-05-01 NOTE — Telephone Encounter (Signed)
Pt aware.

## 2013-05-01 NOTE — Telephone Encounter (Signed)
We have made referral for screening colonoscopy and she should get a call the next couple days regarding that. Leave off Flagyl. Continue Cipro

## 2013-05-01 NOTE — Telephone Encounter (Signed)
Pt was seen on 7-3 for abd pain and she had to stop taking flagyl due to side effects. Pt is still taking cipro. Pt would like to know statues of gi referral

## 2013-05-02 ENCOUNTER — Encounter: Payer: Self-pay | Admitting: Internal Medicine

## 2013-05-04 ENCOUNTER — Telehealth: Payer: Self-pay | Admitting: Family Medicine

## 2013-05-04 NOTE — Telephone Encounter (Signed)
Pt has 2 days left of the CIPRO - has had some relief, but is still nauseated. Colonoscopy is not scheduled until 9/16. She is wondering if it should be sooner? Wants to know if after the 2 days she should do more Cipro? Also, pain she discussed with him on 7/3 OV is still there - thought not as acute.  Please advise re scope and rx and call pt.  Best to call pt at work from [employer] - 5pm @ [phone]

## 2013-05-04 NOTE — Telephone Encounter (Signed)
Informed and pt stated she would call if she is in pain.

## 2013-05-04 NOTE — Telephone Encounter (Signed)
Would not prolong Cipro. Finish out antibiotic. If pain comes back at same severity as previous we will consider CT abdomen and pelvis to evaluate

## 2013-05-16 ENCOUNTER — Other Ambulatory Visit: Payer: Self-pay

## 2013-06-27 ENCOUNTER — Ambulatory Visit (AMBULATORY_SURGERY_CENTER): Payer: PRIVATE HEALTH INSURANCE

## 2013-06-27 VITALS — Ht 65.0 in | Wt 172.0 lb

## 2013-06-27 DIAGNOSIS — Z1211 Encounter for screening for malignant neoplasm of colon: Secondary | ICD-10-CM

## 2013-06-27 MED ORDER — MOVIPREP 100 G PO SOLR: 1.0000 | Freq: Once | ORAL | Status: DC

## 2013-07-10 ENCOUNTER — Encounter: Payer: Self-pay | Admitting: Internal Medicine

## 2013-07-11 ENCOUNTER — Encounter: Payer: Self-pay | Admitting: Internal Medicine

## 2013-07-11 ENCOUNTER — Ambulatory Visit (AMBULATORY_SURGERY_CENTER): Payer: PRIVATE HEALTH INSURANCE | Admitting: Internal Medicine

## 2013-07-11 VITALS — BP 118/69 | HR 55 | Temp 97.5°F | Resp 18 | Ht 65.0 in | Wt 172.0 lb

## 2013-07-11 DIAGNOSIS — Z1211 Encounter for screening for malignant neoplasm of colon: Secondary | ICD-10-CM

## 2013-07-11 DIAGNOSIS — D126 Benign neoplasm of colon, unspecified: Secondary | ICD-10-CM

## 2013-07-11 MED ORDER — SODIUM CHLORIDE 0.9 % IV SOLN: 500.0000 mL | INTRAVENOUS | Status: DC

## 2013-07-11 NOTE — Progress Notes (Signed)
Lidocaine-40mg IV prior to Propofol InductionPropofol given over incremental dosages 

## 2013-07-11 NOTE — Progress Notes (Signed)
Called to room to assist during endoscopic procedure.  Patient ID and intended procedure confirmed with present staff. Received instructions for my participation in the procedure from the performing physician.  

## 2013-07-11 NOTE — Progress Notes (Signed)
Patient did not experience any of the following events: a burn prior to discharge; a fall within the facility; wrong site/side/patient/procedure/implant event; or a hospital transfer or hospital admission upon discharge from the facility. (G8907) Patient did not have preoperative order for IV antibiotic SSI prophylaxis. (G8918)  

## 2013-07-11 NOTE — Op Note (Signed)
[facility] [address] [place], [address]   COLONOSCOPY PROCEDURE REPORT  PATIENT: Tara Cameron, Tara Cameron  MR#: [id number] BIRTHDATE: [birth year] , 51  yrs. old GENDER: Female ENDOSCOPIST: Beverley Fiedler, MD REFERRED XB:[id number] Caryl Never, M.D. PROCEDURE DATE:  07/11/2013 PROCEDURE:   Colonoscopy with cold biopsy polypectomy First Screening Colonoscopy - Avg.  risk and is 50 yrs.  old or older Yes.  Prior Negative Screening - Now for repeat screening. N/A  History of Adenoma - Now for follow-up colonoscopy & has been > or = to 3 yrs.  N/A  Polyps Removed Today? Yes. ASA CLASS:   Class II INDICATIONS:average risk screening and first colonoscopy. MEDICATIONS: MAC sedation, administered by CRNA, Propofol (Diprivan), and propofol (Diprivan) 250mg  IV  DESCRIPTION OF PROCEDURE:   After the risks benefits and alternatives of the procedure were thoroughly explained, informed consent was obtained.  A digital rectal exam revealed external hemorrhoids.   The LB [id number]  endoscope was introduced through the anus and advanced to the cecum, which was identified by both the appendix and ileocecal valve. No adverse events experienced.   The quality of the prep was good, using MoviPrep The instrument was then slowly withdrawn as the colon was fully examined.  COLON FINDINGS: Two sessile polyps ranging between 3-75mm in size were found in the ascending colon and sigmoid colon.  Polypectomy was performed with cold forceps.  All resections were complete and all polyp tissue was completely retrieved.   Mild diverticulosis was noted in the descending colon and sigmoid colon.  Retroflexed views revealed external hemorrhoid and skin tag. The time to cecum=2 minutes 42 seconds.  Withdrawal time=9 minutes 14 seconds. The scope was withdrawn and the procedure completed. COMPLICATIONS: There were no complications.  ENDOSCOPIC IMPRESSION: 1.   Two sessile polyps ranging between 3-54mm  in size were found in the ascending colon and sigmoid colon; Polypectomy was performed with cold forceps 2.   Mild diverticulosis was noted in the descending colon and sigmoid colon  RECOMMENDATIONS: 1.  Await pathology results 2.  High fiber diet 3.  If the polyps removed today are proven to be adenomatous (pre-cancerous) polyps, you will need a repeat colonoscopy in 5 years.  Otherwise you should continue to follow colorectal cancer screening guidelines for "routine risk" patients with colonoscopy in 10 years.  You will receive a letter within 1-2 weeks with the results of your biopsy as well as final recommendations.  Please call my office if you have not received a letter after 3 weeks.   eSigned:  Beverley Fiedler, MD 07/11/2013 10:34 AM    cc: The Patient and Evelena Peat, MD

## 2013-07-11 NOTE — Patient Instructions (Addendum)
Discharge instructions given with verbal understanding. Handouts on polyps and diverticulosis. Resume previous medications. YOU HAD AN ENDOSCOPIC PROCEDURE TODAY AT THE [facility]: Refer to the procedure report that was given to you for any specific questions about what was found during the examination.  If the procedure report does not answer your questions, please call your gastroenterologist to clarify.  If you requested that your care partner not be given the details of your procedure findings, then the procedure report has been included in a sealed envelope for you to review at your convenience later.  YOU SHOULD EXPECT: Some feelings of bloating in the abdomen. Passage of more gas than usual.  Walking can help get rid of the air that was put into your GI tract during the procedure and reduce the bloating. If you had a lower endoscopy (such as a colonoscopy or flexible sigmoidoscopy) you may notice spotting of blood in your stool or on the toilet paper. If you underwent a bowel prep for your procedure, then you may not have a normal bowel movement for a few days.  DIET: Your first meal following the procedure should be a light meal and then it is ok to progress to your normal diet.  A half-sandwich or bowl of soup is an example of a good first meal.  Heavy or fried foods are harder to digest and may make you feel nauseous or bloated.  Likewise meals heavy in dairy and vegetables can cause extra gas to form and this can also increase the bloating.  Drink plenty of fluids but you should avoid alcoholic beverages for 24 hours.  ACTIVITY: Your care partner should take you home directly after the procedure.  You should plan to take it easy, moving slowly for the rest of the day.  You can resume normal activity the day after the procedure however you should NOT DRIVE or use heavy machinery for 24 hours (because of the sedation medicines used during the test).    SYMPTOMS TO REPORT  IMMEDIATELY: A gastroenterologist can be reached at any hour.  During normal business hours, 8:30 AM to 5:00 PM Monday through Friday, call [phone].  After hours and on weekends, please call the GI answering service at [phone] who will take a message and have the physician on call contact you.   Following lower endoscopy (colonoscopy or flexible sigmoidoscopy):  Excessive amounts of blood in the stool  Significant tenderness or worsening of abdominal pains  Swelling of the abdomen that is new, acute  Fever of 100F or higher  FOLLOW UP: If any biopsies were taken you will be contacted by phone or by letter within the next 1-3 weeks.  Call your gastroenterologist if you have not heard about the biopsies in 3 weeks.  Our staff will call the home number listed on your records the next business day following your procedure to check on you and address any questions or concerns that you may have at that time regarding the information given to you following your procedure. This is a courtesy call and so if there is no answer at the home number and we have not heard from you through the emergency physician on call, we will assume that you have returned to your regular daily activities without incident.  SIGNATURES/CONFIDENTIALITY: You and/or your care partner have signed paperwork which will be entered into your electronic medical record.  These signatures attest to the fact that that the information above on your After Visit Summary   has been reviewed and is understood.  Full responsibility of the confidentiality of this discharge information lies with you and/or your care-partner. 

## 2013-07-12 ENCOUNTER — Telehealth: Payer: Self-pay | Admitting: *Deleted

## 2013-07-12 NOTE — Telephone Encounter (Signed)
Left message on number given in admitting to call office back  if questions, problems or concerns. ewm

## 2013-07-15 IMAGING — CT CT CHEST W/O CM
1 of 2 series · 14 of 32 positions shown, 18 images · non-contrast
Comparison: None.

CLINICAL DATA: Abnormal radiograph

CT CHEST WITHOUT CONTRAST
TECHNIQUE: Multidetector CT imaging of the chest was performed
following the standard protocol without IV contrast.

[Series 2: chest w/o st · axial · non-contrast · 0.71mm/px · z∈[-264,-4]mm · 14 of 62 slices shown, 18 images]
[im 5/62  mediastinal]
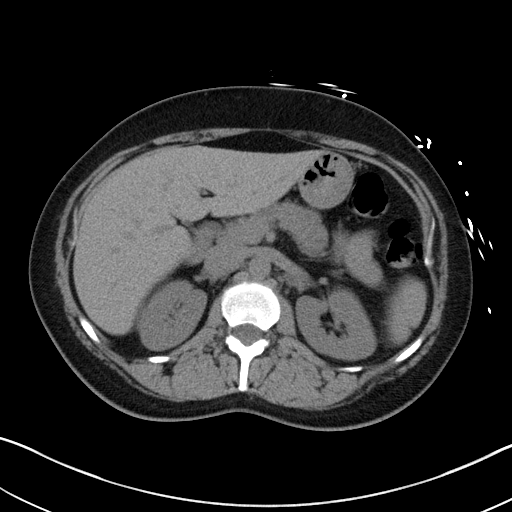
[im 5/62  lung]
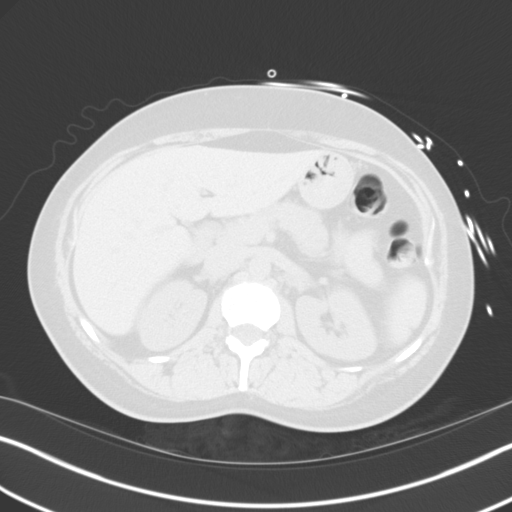
[im 10/62  lung]
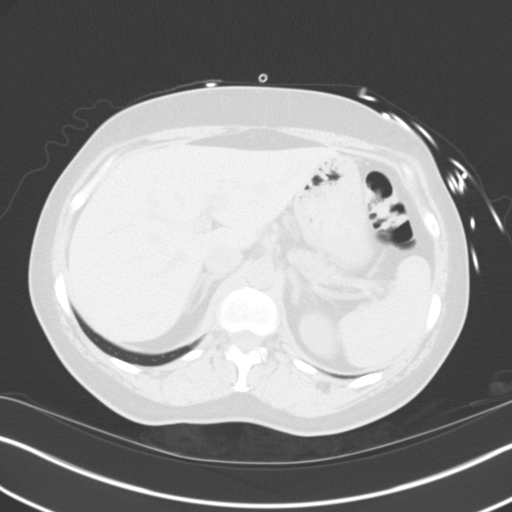
[im 15/62  lung]
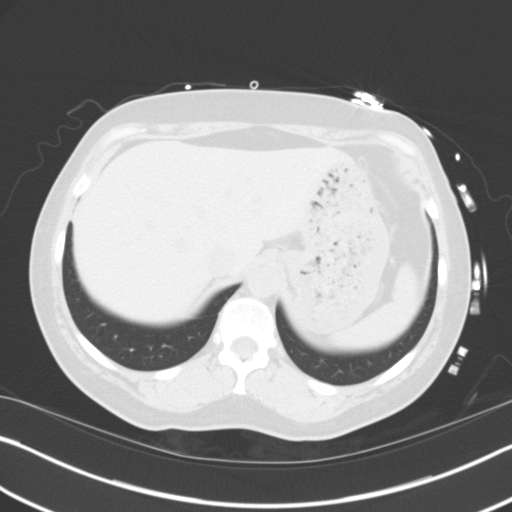
[im 19/62  lung]
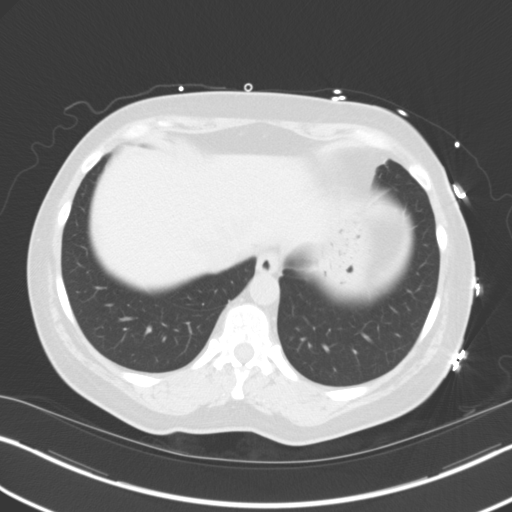
[im 24/62  mediastinal]
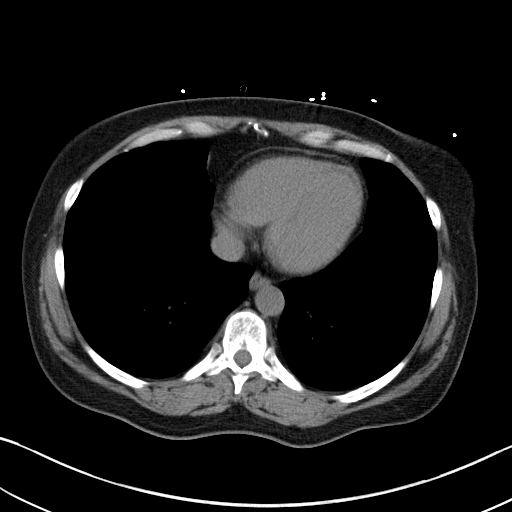
[im 24/62  lung]
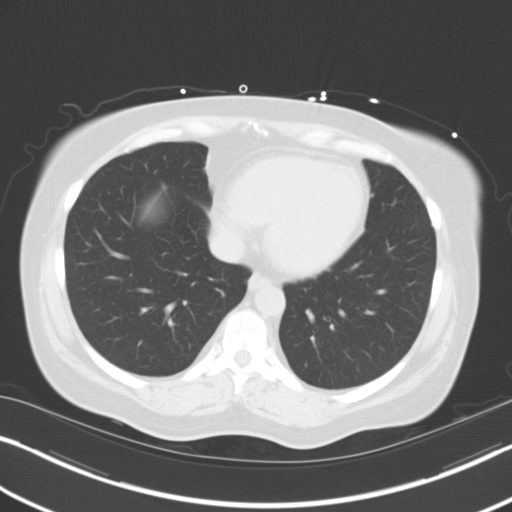
[im 29/62  lung]
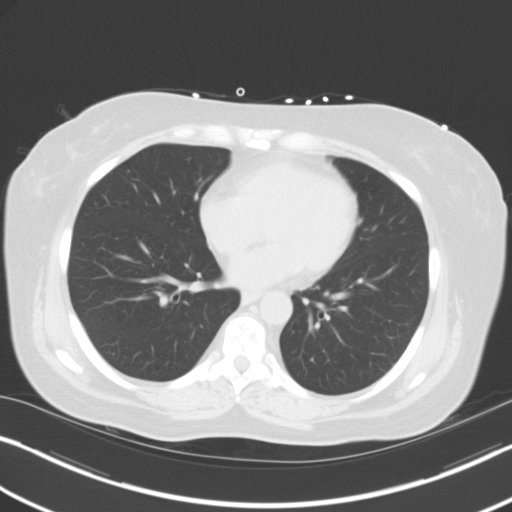
[im 30/62  lung]
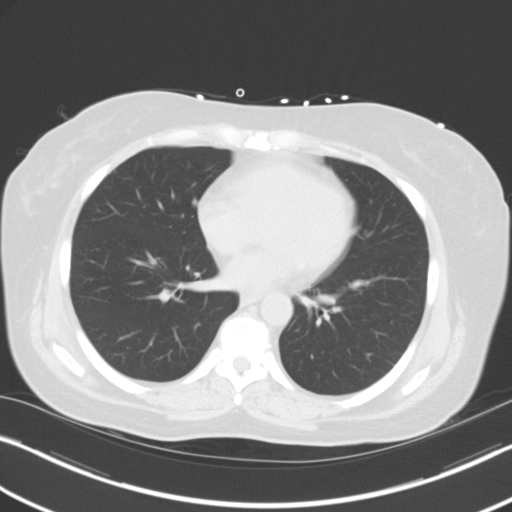
[im 31/62  lung]
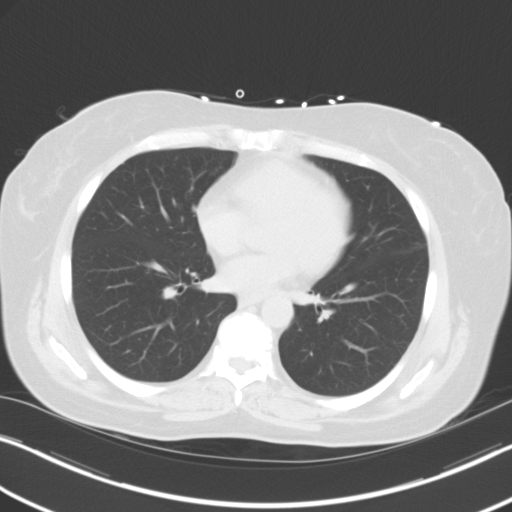
[im 33/62  mediastinal]
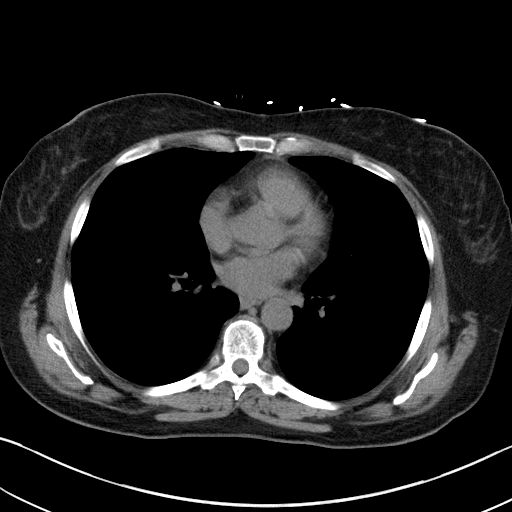
[im 33/62  lung]
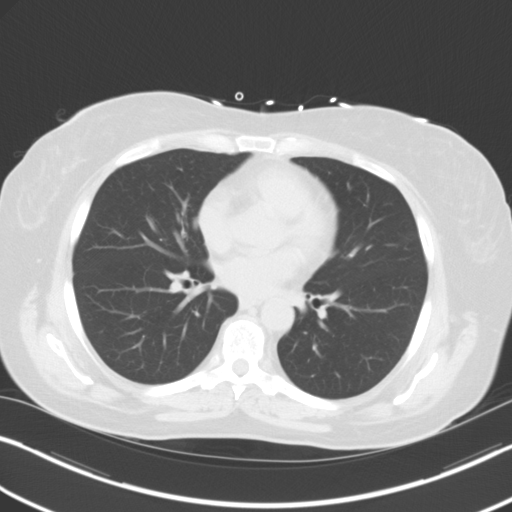
[im 38/62  lung]
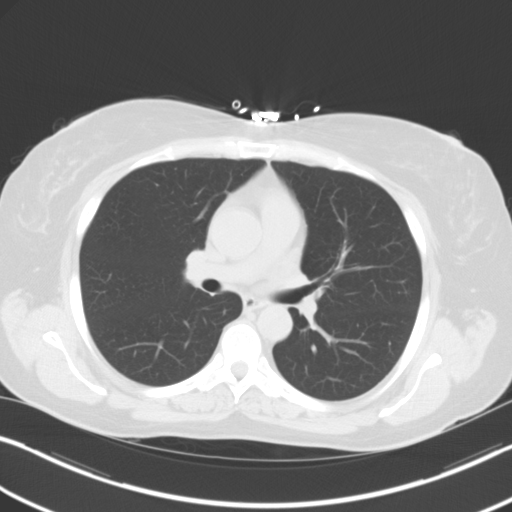
[im 43/62  lung]
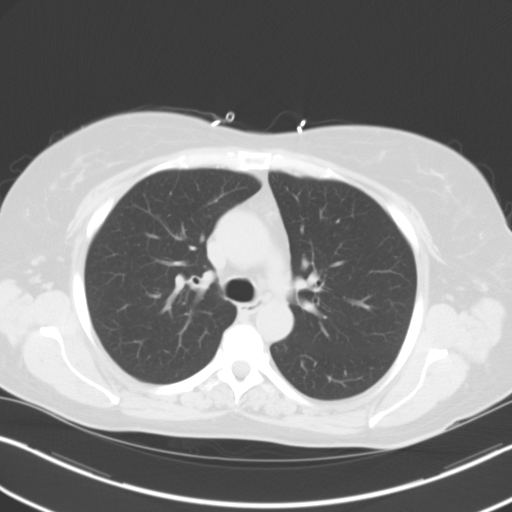
[im 47/62  lung]
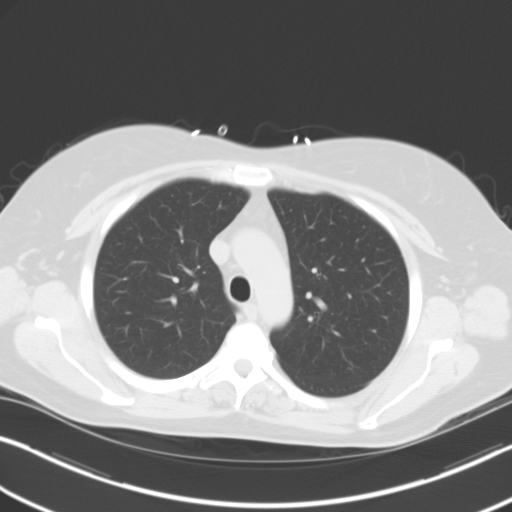
[im 52/62  mediastinal]
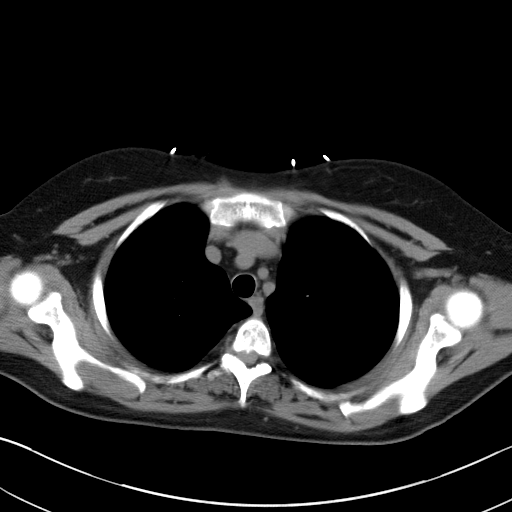
[im 52/62  lung]
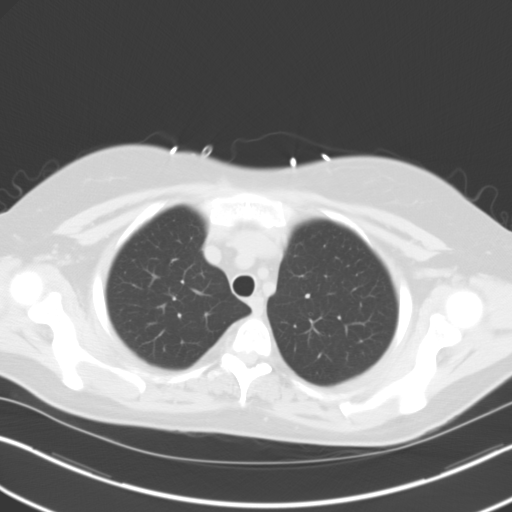
[im 57/62  lung]
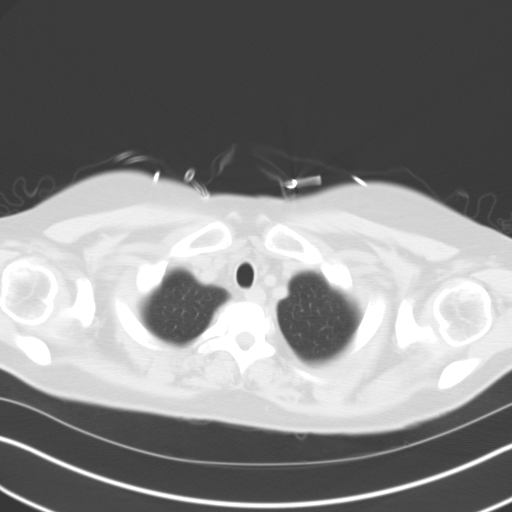

[14 of 32 positions shown; findings below may reference images not displayed]

FINDINGS: Right middle lobe calcified granuloma is associated with
right hilar calcifications.  This is consistent with old
granulomatous disease.    3 mm right middle lobe nodule on image
34.  Irregular density in the right upper lobe peripherally on
image 18.  This is only 3 mm.

No pneumothorax and no pleural effusion.

No abnormal mediastinal adenopathy.  No pericardial effusion.

No destructive bone lesion.
IMPRESSION: Radiographic abnormality corresponds to a benign calcified
granuloma.

There is a 3 mm soft tissue nodule in the right middle lobe not
visualized on the radiograph but present. If the patient is at high
risk for bronchogenic carcinoma, follow-up chest CT at 1 year is
recommended.  If the patient is at low risk, no follow-up is
needed.  This recommendation follows the consensus statement:
Guidelines for Management of Small Pulmonary Nodules Detected on CT
Scans:  A Statement from the [HOSPITAL] as published in

## 2013-07-15 IMAGING — CR DG CHEST 2V
2 series · 2 of 2 positions shown · non-contrast
Comparison: None.

CLINICAL DATA: Chest pain.  Short of breath.

CHEST - 2 VIEW

[w chest lat]
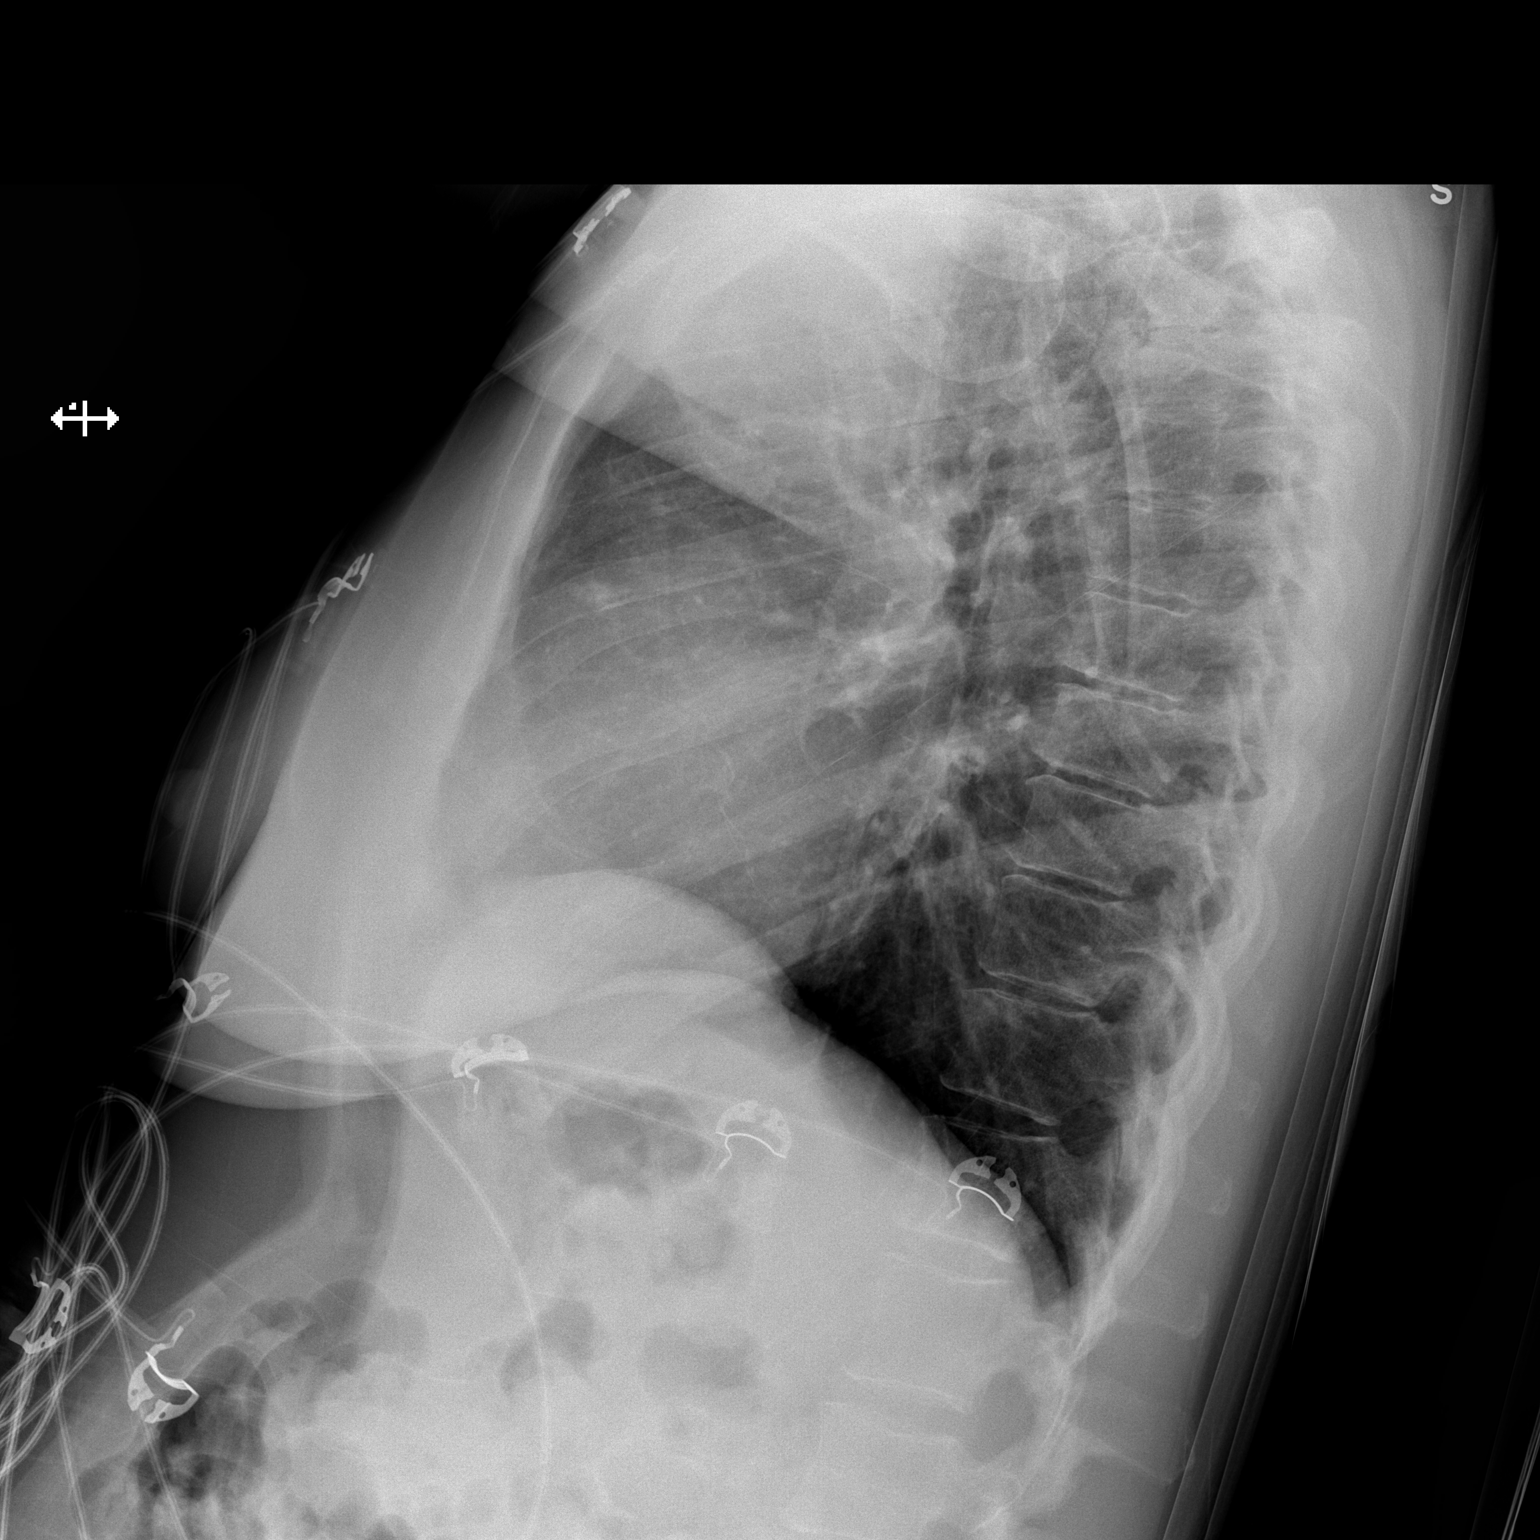

[x chest ap]
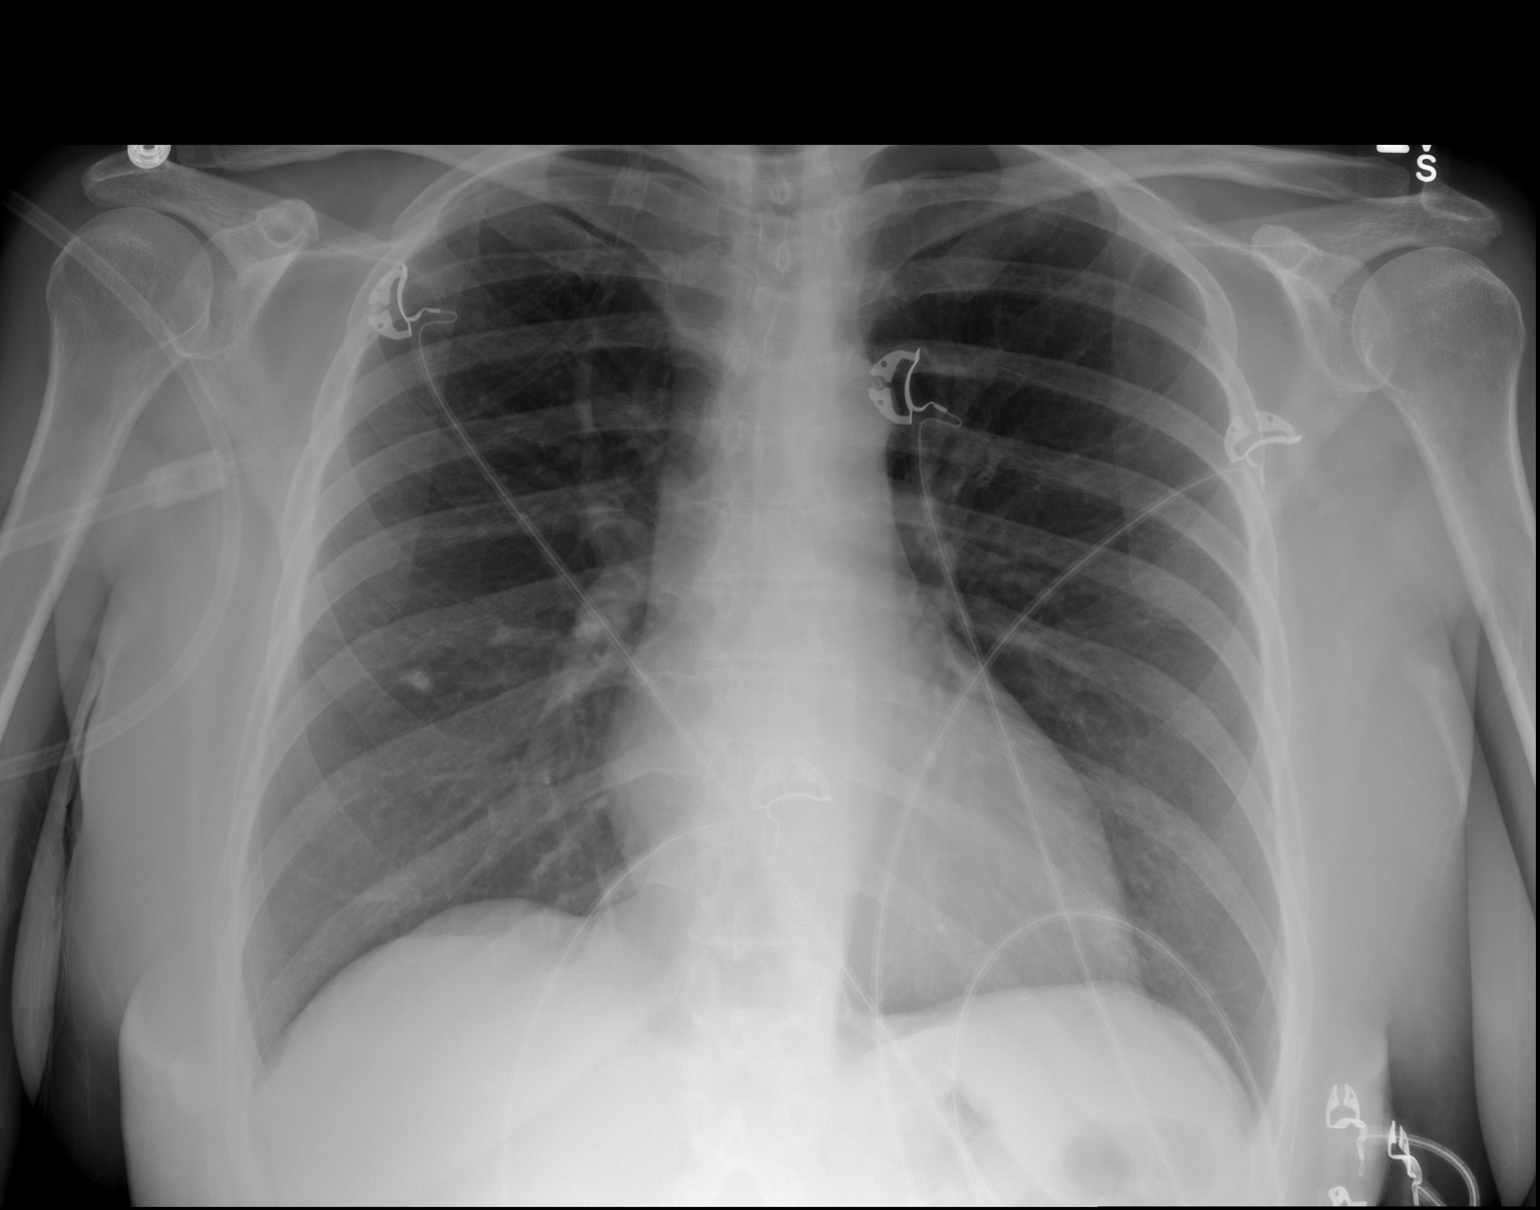

[2 of 2 positions shown; findings below may reference images not displayed]

FINDINGS: No airspace disease or effusion is present.  There is a
spiculated nodule in the superior right middle lobe measuring 9 mm
seen on both frontal lateral projections.  Follow-up chest CT is
recommended, preferably with infusion is seen. Monitoring leads are
projected over the chest.  Cardiopericardial silhouette appears
within normal limits.
IMPRESSION: 9 mm spiculated nodule present in the right middle lobe suspicious
for neoplasm.  Follow-up chest CT, preferably with infusion
recommended.  No acute abnormality.

These results will be called to the ordering clinician or
representative by the Radiologist Assistant, and communication
documented in the PACS Dashboard.

## 2013-07-18 ENCOUNTER — Encounter: Payer: Self-pay | Admitting: Internal Medicine

## 2013-08-31 ENCOUNTER — Other Ambulatory Visit: Payer: Self-pay

## 2013-09-18 ENCOUNTER — Other Ambulatory Visit: Payer: Self-pay | Admitting: Family Medicine

## 2013-10-25 ENCOUNTER — Other Ambulatory Visit: Payer: Self-pay | Admitting: Family Medicine

## 2013-10-28 ENCOUNTER — Other Ambulatory Visit: Payer: Self-pay | Admitting: Family Medicine

## 2014-01-22 ENCOUNTER — Ambulatory Visit (INDEPENDENT_AMBULATORY_CARE_PROVIDER_SITE_OTHER): Payer: 59 | Admitting: Family Medicine

## 2014-01-22 ENCOUNTER — Encounter: Payer: Self-pay | Admitting: Family Medicine

## 2014-01-22 VITALS — BP 128/80 | HR 70 | Temp 98.4°F | Wt 174.0 lb

## 2014-01-22 DIAGNOSIS — E039 Hypothyroidism, unspecified: Secondary | ICD-10-CM

## 2014-01-22 DIAGNOSIS — E785 Hyperlipidemia, unspecified: Secondary | ICD-10-CM

## 2014-01-22 LAB — HEPATIC FUNCTION PANEL
ALBUMIN: 4.6 g/dL (ref 3.5–5.2)
ALT: 23 U/L (ref 0–35)
AST: 23 U/L (ref 0–37)
Alkaline Phosphatase: 66 U/L (ref 39–117)
BILIRUBIN TOTAL: 1 mg/dL (ref 0.3–1.2)
Bilirubin, Direct: 0.1 mg/dL (ref 0.0–0.3)
Total Protein: 7.6 g/dL (ref 6.0–8.3)

## 2014-01-22 LAB — BASIC METABOLIC PANEL
BUN: 15 mg/dL (ref 6–23)
CALCIUM: 9.7 mg/dL (ref 8.4–10.5)
CHLORIDE: 103 meq/L (ref 96–112)
CO2: 27 meq/L (ref 19–32)
Creatinine, Ser: 1 mg/dL (ref 0.4–1.2)
GFR: 64.78 mL/min (ref >60.00)
Glucose, Bld: 99 mg/dL (ref 70–99)
Potassium: 5.1 mEq/L (ref 3.5–5.1)
SODIUM: 138 meq/L (ref 135–145)

## 2014-01-22 LAB — LIPID PANEL
CHOL/HDL RATIO: 3
Cholesterol: 175 mg/dL (ref 0–200)
HDL: 61.3 mg/dL (ref >39.00)
LDL CALC: 90 mg/dL (ref 0–99)
Triglycerides: 119 mg/dL (ref 0.0–149.0)
VLDL: 23.8 mg/dL (ref 0.0–40.0)

## 2014-01-22 LAB — TSH: TSH: 2.41 u[IU]/mL (ref 0.35–5.50)

## 2014-01-22 NOTE — Progress Notes (Signed)
Pre visit review using our clinic review tool, if applicable. No additional management support is needed unless otherwise documented below in the visit note. 

## 2014-01-22 NOTE — Progress Notes (Signed)
   Subjective:    Patient ID: Tara Cameron, female    DOB: 12/10/1960, 53 y.o.   MRN: [id number]  HPI  Medical followup. Patient has history of hyperlipidemia, ongoing nicotine use, depression, GERD, hypothyroidism. Has history of posttraumatic stress disorder. She is followed regularly by psychiatrist. Unfortunately, she just lost her job this past Friday. She is handling this fairly well. She remains on trazodone at night for sleep and is trying to taper off lorazepam. She takes Lipitor for hyperlipidemia and levothyroxin for hypothyroidism. Denies any recent chest pains. No dizziness. Compliant with medications. Her depression is been stable  Past Medical History  Diagnosis Date  . Depression   . GERD (gastroesophageal reflux disease)   . Allergy   . Arrhythmia   . Cardiac murmur   . Hypertension   . Thyroid disease   . Panic attack   . Anxiety   . Hyperlipidemia   . Chest pain     normal myoview 04/2012  . Abnormal CXR     04/2012 - 32mm spiculated RML pulmonary nodule found on CXR, f/u chest CT noted correspondence with benign calcified granuloma, recommendations for f/u CT in one year  . Tobacco abuse    Past Surgical History  Procedure Laterality Date  . Tonsillectomy  1969  . Abdominal hysterectomy  1995  . Hysteroscopy  1994  . Carpal tunnel release      1988, 1994    reports that she has been smoking Cigarettes.  She has a 2.5 pack-year smoking history. She has never used smokeless tobacco. She reports that she drinks alcohol. She reports that she does not use illicit drugs. family history includes Cancer in her father and mother; Heart attack (age of onset: 59) in her maternal grandfather. There is no history of Colon cancer, Rectal cancer, Stomach cancer, or Pancreatic cancer. Allergies  Allergen Reactions  . Benzodiazepines Other (See Comments)    Had a suicide attempt that occurred while under the influence of benzodiazepines, which could have been disinhibition  from Benzos  . Erythromycin     hives  . Iodine     hives  . Sulfa Antibiotics     hives  . Zithromax [Azithromycin Dihydrate]     hives      Review of Systems  Constitutional: Negative for fatigue and unexpected weight change.  Eyes: Negative for visual disturbance.  Respiratory: Negative for cough, chest tightness, shortness of breath and wheezing.   Cardiovascular: Negative for chest pain, palpitations and leg swelling.  Endocrine: Negative for polydipsia and polyuria.  Genitourinary: Negative for dysuria.  Neurological: Negative for dizziness, seizures, syncope, weakness, light-headedness and headaches.       Objective:   Physical Exam  Constitutional: She appears well-developed and well-nourished. No distress.  Neck: Neck supple. No thyromegaly present.  Cardiovascular: Normal rate.   Pulmonary/Chest: Effort normal and breath sounds normal. No respiratory distress. She has no wheezes. She has no rales.  Musculoskeletal: She exhibits no edema.          Assessment & Plan:  #1 hypothyroidism. Recheck TSH #2 dyslipidemia. Recheck lipid and hepatic panel. Continue Lipitor #3 history of GERD which is well controlled on omeprazole. #4 nicotine use. We discussed options. She is reluctant to try things like Chantix with her history of depression. She'll consider nicotine replacement options such as nicotine patches

## 2014-01-23 ENCOUNTER — Telehealth: Payer: Self-pay | Admitting: Family Medicine

## 2014-01-23 NOTE — Telephone Encounter (Signed)
Relevant patient education mailed to patient.  

## 2014-02-16 ENCOUNTER — Telehealth: Payer: Self-pay | Admitting: Family Medicine

## 2014-02-16 NOTE — Telephone Encounter (Signed)
Pt needs blood work results from 01-22-14. Pt no longer has ins and can not afford generic lipitor. Pt would like cheaper med for chole call [facility]

## 2014-02-16 NOTE — Telephone Encounter (Signed)
Pt informed

## 2014-02-16 NOTE — Telephone Encounter (Signed)
The results are in pt chart

## 2014-02-16 NOTE — Telephone Encounter (Signed)
Her labs were all normal.  She will need to find out if there is another generic statin that will be cheaper-eg pravastatin, simvastatin, etc.

## 2014-04-08 ENCOUNTER — Other Ambulatory Visit: Payer: Self-pay | Admitting: Family Medicine

## 2014-04-09 ENCOUNTER — Other Ambulatory Visit: Payer: Self-pay | Admitting: Family Medicine

## 2014-04-19 ENCOUNTER — Ambulatory Visit (INDEPENDENT_AMBULATORY_CARE_PROVIDER_SITE_OTHER): Payer: PRIVATE HEALTH INSURANCE | Admitting: Family Medicine

## 2014-04-19 ENCOUNTER — Telehealth: Payer: Self-pay | Admitting: Family Medicine

## 2014-04-19 ENCOUNTER — Encounter: Payer: Self-pay | Admitting: Family Medicine

## 2014-04-19 VITALS — BP 130/80 | HR 88 | Temp 98.3°F | Wt 173.0 lb

## 2014-04-19 DIAGNOSIS — F419 Anxiety disorder, unspecified: Secondary | ICD-10-CM

## 2014-04-19 DIAGNOSIS — F411 Generalized anxiety disorder: Secondary | ICD-10-CM

## 2014-04-19 DIAGNOSIS — E039 Hypothyroidism, unspecified: Secondary | ICD-10-CM

## 2014-04-19 NOTE — Progress Notes (Signed)
   Subjective:    Patient ID: Tara Cameron, female    DOB: [birth year], 53 y.o.   MRN: [id number]  HPI Patient here to discuss hypothyroidism and history of depression and anxiety. Her thyroid was checked just last March and normal. She does not need refills at this time. She has done extremely well overall. She is sleeping okay. Her depression has been stable and she has tapered herself off trazodone and fluoxetine.  She has previously seen psychiatrist and is basically requesting whether we can take over her refills of her lorazepam when they are due. She is getting ready to move to another city nearby [facility]) and because of loss of insurance has tried to simplify things with one provider. She is also try to taper off the lorazepam but still takes 0.5 mg 2-3 times daily.   Review of Systems  Constitutional: Negative for appetite change and unexpected weight change.  Respiratory: Negative for shortness of breath.   Cardiovascular: Negative for chest pain.  Psychiatric/Behavioral: Negative for confusion, sleep disturbance, dysphoric mood and agitation.       Objective:   Physical Exam  Constitutional: She appears well-developed and well-nourished.  Cardiovascular: Normal rate and regular rhythm.   Pulmonary/Chest: Effort normal and breath sounds normal. No respiratory distress. She has no wheezes. She has no rales.  Psychiatric: She has a normal mood and affect. Her behavior is normal. Judgment and thought content normal.          Assessment & Plan:  #1 hypothyroidism. Recent TSH at goal. Refill when necessary. #2 history of depression/anxiety. Currently stable off antidepressant. We will take over her refills of lorazepam but we have strongly advocating that she try to taper back on this

## 2014-04-19 NOTE — Telephone Encounter (Signed)
Relevant patient education mailed to patient.  

## 2014-04-19 NOTE — Progress Notes (Signed)
Pre visit review using our clinic review tool, if applicable. No additional management support is needed unless otherwise documented below in the visit note. 

## 2014-06-20 ENCOUNTER — Telehealth: Payer: Self-pay | Admitting: Family Medicine

## 2014-06-20 MED ORDER — LORAZEPAM 0.5 MG PO TABS: 0.5000 mg | ORAL_TABLET | Freq: Three times a day (TID) | ORAL | Status: DC

## 2014-06-20 NOTE — Telephone Encounter (Signed)
Pt request refill of the following: LORazepam (ATIVAN) 0.5 MG tablet   Pt said Dr Elease Hashimoto said he would refill this for her   Phamacy:  [facility]

## 2014-06-20 NOTE — Telephone Encounter (Signed)
Called in RX to pharmacy

## 2014-06-20 NOTE — Telephone Encounter (Signed)
Last visit 04/19/14

## 2014-06-20 NOTE — Telephone Encounter (Signed)
May refill but would recommend trying to taper off this, if possible, as previously discussed.

## 2014-08-10 ENCOUNTER — Telehealth: Payer: Self-pay | Admitting: Family Medicine

## 2014-08-10 NOTE — Telephone Encounter (Signed)
Pt is transferring to [facility]. They would like to know if it ok to switch to a different brand of levothyroxine (SYNTHROID, LEVOTHROID) 112 MCG tablet

## 2014-08-13 NOTE — Telephone Encounter (Signed)
Pharmacist is aware.  

## 2014-10-22 ENCOUNTER — Telehealth: Payer: Self-pay

## 2014-10-22 MED ORDER — LEVOTHYROXINE SODIUM 112 MCG PO TABS: ORAL_TABLET | ORAL | Status: DC

## 2014-10-22 NOTE — Telephone Encounter (Signed)
Levothyroxine 165mcg #34  Sent to [facility] in [place]

## 2014-10-22 NOTE — Telephone Encounter (Signed)
Rx sent to pharmacy   

## 2014-10-22 NOTE — Addendum Note (Signed)
Addended by: Marcina Millard on: 10/22/2014 02:16 PM   Modules accepted: Orders

## 2015-03-19 ENCOUNTER — Encounter: Payer: Self-pay | Admitting: *Deleted

## 2017-07-15 ENCOUNTER — Encounter: Payer: Self-pay | Admitting: Family Medicine

## 2017-10-22 ENCOUNTER — Other Ambulatory Visit: Payer: Self-pay | Admitting: Family Medicine

## 2017-10-22 DIAGNOSIS — S0990XA Unspecified injury of head, initial encounter: Secondary | ICD-10-CM

## 2017-10-25 ENCOUNTER — Other Ambulatory Visit: Payer: Self-pay

## 2017-11-19 ENCOUNTER — Ambulatory Visit
Admission: RE | Admit: 2017-11-19 | Discharge: 2017-11-19 | Disposition: A | Discharge status: Home / Self Care | Payer: BLUE CROSS/BLUE SHIELD | Source: Ambulatory Visit | Attending: Family Medicine | Admitting: Family Medicine

## 2017-11-19 DIAGNOSIS — S0990XA Unspecified injury of head, initial encounter: Secondary | ICD-10-CM

## 2017-11-22 ENCOUNTER — Other Ambulatory Visit: Payer: Self-pay

## 2018-01-25 ENCOUNTER — Ambulatory Visit: Payer: Self-pay | Admitting: Neurology

## 2018-02-09 ENCOUNTER — Ambulatory Visit: Payer: Self-pay | Admitting: Neurology

## 2018-03-17 ENCOUNTER — Other Ambulatory Visit: Payer: Self-pay

## 2018-03-17 ENCOUNTER — Encounter: Payer: Self-pay | Admitting: Neurology

## 2018-03-17 ENCOUNTER — Ambulatory Visit: Payer: BLUE CROSS/BLUE SHIELD | Admitting: Neurology

## 2018-03-17 VITALS — BP 120/72 | HR 100 | Ht 65.0 in | Wt 162.0 lb

## 2018-03-17 DIAGNOSIS — G250 Essential tremor: Secondary | ICD-10-CM | POA: Diagnosis not present

## 2018-03-17 DIAGNOSIS — R4189 Other symptoms and signs involving cognitive functions and awareness: Secondary | ICD-10-CM

## 2018-03-17 MED ORDER — PROPRANOLOL HCL 20 MG PO TABS: ORAL_TABLET | ORAL | 11 refills | Status: DC

## 2018-03-17 NOTE — Progress Notes (Signed)
NEUROLOGY CONSULTATION NOTE  Tara Cameron MRN: [id number] DOB: 1961-01-22  Referring provider: Dr. Rachell Cipro Primary care provider: Dr. Rachell Cipro  Reason for consult:  Seizures per referral  Dear Dr Ernie Hew:  Thank you for your kind referral of Tara Cameron for consultation of the above symptoms. Although her history is well known to you, please allow me to reiterate it for the purpose of our medical record. She is alone in the office today. Records and images were personally reviewed where available.   HISTORY OF PRESENT ILLNESS: This is a 57 year old right-handed woman with a history of hypertension, hyperlipidemia, hypothyroidism, depression, anxiety, presenting for evaluation of seizures. She states that "seizures have been ruled out" and that she is here for tremors and cognitive changes. She started noticing intermittent right hand tremors in July 2018 when she picked up a chip at work. It went away after a few minutes. Since then, she has noticed tremors affecting both hands, mostly with movements but also sometimes when she is just sitting down. She has noticed that when she is relaxing, there would be a small twitch where a finger would extend up. When she is on a recliner, her leg would move, or left leg twitches up and down. She works as a Research scientist (physical sciences) and has noticed her handwriting has gotten smaller. One time she came home and had pain on her right forearm down to the palm, sometimes she has the same pain on her left palm. She has noticed occasional foot cramps in the soles of both feet and toes when resting. She reports involuntary movements in sleep that wake her up, her left shoulder jerks back. This has rarely occurred in the daytime while watching TV. No family history of tremors. She has been taking lorazepam 0.5mg  for several years, dose was increased to 1 mg TID a month and a half ago to see if the tremor was due to anxiety, but she has not noticed any  difference in her tremor on higher dose. No other changes to Effexor, lamotrigine, Trazodone doses. Her last TSH in March 2019 was normal.   She has intermittent numbness in both arms, and one time had chin numbness lasting 10 seconds. She reports balance issues for the past couple of months, she would veer to either side, then last weekend was told by her husband that she was shuffling but she did not notice it. After she paid attention, she started walking better. She occasionally feels dizzy when bending down and has to catch herself, no falls. She reports memory changes in the past 2 months or so. She has always been a very good speller, but now has to use Spell Check more. She is more forgetful, forgetting names of family and friends, her car keys, paperwork at work or if she has already done a task. She gets confused when being told to do something, "it is like a blank" and she cannot recall how to start a task. Sometimes she forgets where to turn when driving. No missed bills or medications. She has noticed she would stare when things are quiet, she responds when called. Sleep is overall good, her husband has told her she is "all over the place." She feels her mood is "a little anxious." She is worried about the tremors and worries that she was exposed to chemicals when living [place] ex[place]band [place]. She denies any diplopia but her eyes go "back and forth quick" when reading. She is  tired all the time.   Head CT without contrast done 10/2017 was normal.  PAST MEDICAL HISTORY: Past Medical History:  Diagnosis Date  . Abnormal CXR    04/2012 - 21mm spiculated RML pulmonary nodule found on CXR, f/u chest CT noted correspondence with benign calcified granuloma, recommendations for f/u CT in one year  . Allergy   . Anxiety   . Arrhythmia   . Cardiac murmur   . Chest pain    normal myoview 04/2012  . Depression   . GERD (gastroesophageal reflux disease)   . Hyperlipidemia   . Hypertension     . Panic attack   . Thyroid disease   . Tobacco abuse     PAST SURGICAL HISTORY: Past Surgical History:  Procedure Laterality Date  . ABDOMINAL HYSTERECTOMY  1995  . [place]  . HYSTEROSCOPY  1994  . TONSILLECTOMY  1969    MEDICATIONS:  Outpatient Encounter Medications as of 03/17/2018  Medication Sig  . lamoTRIgine (LAMICTAL) 200 MG tablet   . levothyroxine (SYNTHROID, LEVOTHROID) 125 MCG tablet   . LORazepam (ATIVAN) 1 MG tablet   . omeprazole (PRILOSEC) 40 MG capsule   . SYMBICORT 160-4.5 MCG/ACT inhaler   . traZODone (DESYREL) 150 MG tablet   . venlafaxine XR (EFFEXOR-XR) 75 MG 24 hr capsule   . [DISCONTINUED] LORazepam (ATIVAN) 0.5 MG tablet Take 1 tablet (0.5 mg total) by mouth every 8 (eight) hours. Per psychologist  . [DISCONTINUED] clonazePAM (KLONOPIN) 1 MG tablet   . [DISCONTINUED] lamoTRIgine (LAMICTAL) 150 MG tablet   . [DISCONTINUED] levothyroxine (SYNTHROID, LEVOTHROID) 112 MCG tablet TAKE 1 TABLET BY MOUTH EVERY DAY *INSURANCE ONLY COVERS 34 DAYS*  . [DISCONTINUED] omeprazole (PRILOSEC) 20 MG capsule Take 1 capsule (20 mg total) by mouth daily. For stomach  . [DISCONTINUED] temazepam (RESTORIL) 30 MG capsule   . [DISCONTINUED] tiZANidine (ZANAFLEX) 4 MG tablet    No facility-administered encounter medications on file as of 03/17/2018.     ALLERGIES: Allergies  Allergen Reactions  . Benzodiazepines Other (See Comments)    Had a suicide attempt that occurred while under the influence of benzodiazepines, which could have been disinhibition from Benzos  . Erythromycin     hives  . Iodine     hives  . Sulfa Antibiotics     hives  . Zithromax [Azithromycin Dihydrate]     hives    FAMILY HISTORY: Family History  Problem Relation Age of Onset  . Cancer Mother        breast  . Cancer Father        lung  . Heart attack Maternal Grandfather 55  . Colon cancer Neg Hx   . Rectal cancer Neg Hx   . Stomach cancer Neg Hx   .  Pancreatic cancer Neg Hx     SOCIAL HISTORY: Social History   Socioeconomic History  . Marital status: Married    Spouse name: Not on file  . Number of children: Not on file  . Years of education: Not on file  . Highest education level: Not on file  Occupational History  . Not on file  Social Needs  . Financial resource strain: Not on file  . Food insecurity:    Worry: Not on file    Inability: Not on file  . Transportation needs:    Medical: Not on file    Non-medical: Not on file  Tobacco Use  . Smoking status: Current Some Day  Smoker    Packs/day: 0.50    Years: 5.00    Pack years: 2.50    Types: Cigarettes    Last attempt to quit: 05/04/2012    Years since quitting: 5.8  . Smokeless tobacco: Never Used  Substance and Sexual Activity  . Alcohol use: Yes    Comment: social  . Drug use: No  . Sexual activity: Never  Lifestyle  . Physical activity:    Days per week: Not on file    Minutes per session: Not on file  . Stress: Not on file  Relationships  . Social connections:    Talks on phone: Not on file    Gets together: Not on file    Attends religious service: Not on file    Active member of club or organization: Not on file    Attends meetings of clubs or organizations: Not on file    Relationship status: Not on file  . Intimate partner violence:    Fear of current or ex partner: Not on file    Emotionally abused: Not on file    Physically abused: Not on file    Forced sexual activity: Not on file  Other Topics Concern  . Not on file  Social History Narrative   Lives in [place].     REVIEW OF SYSTEMS: Constitutional: No fevers, chills, or sweats, no generalized fatigue, change in appetite Eyes: No visual changes, double vision, eye pain Ear, nose and throat: No hearing loss, ear pain, nasal congestion, sore throat Cardiovascular: No chest pain, palpitations Respiratory:  No shortness of breath at rest or with exertion, wheezes GastrointestinaI: No  nausea, vomiting, diarrhea, abdominal pain, fecal incontinence Genitourinary:  No dysuria, urinary retention or frequency Musculoskeletal:  No neck pain, back pain Integumentary: No rash, pruritus, skin lesions Neurological: as above Psychiatric: No depression, insomnia, anxiety Endocrine: No palpitations, fatigue, diaphoresis, mood swings, change in appetite, change in weight, increased thirst Hematologic/Lymphatic:  No anemia, purpura, petechiae. Allergic/Immunologic: no itchy/runny eyes, nasal congestion, recent allergic reactions, rashes  PHYSICAL EXAM: Vitals:   03/17/18 1118  BP: 120/72  Pulse: 100  SpO2: 99%   General: No acute distress Head:  Normocephalic/atraumatic Eyes: Fundoscopic exam shows bilateral sharp discs, no vessel changes, exudates, or hemorrhages Neck: supple, no paraspinal tenderness, full range of motion Back: No paraspinal tenderness Heart: regular rate and rhythm Lungs: Clear to auscultation bilaterally. Vascular: No carotid bruits. Skin/Extremities: No rash, no edema Neurological Exam: Mental status: alert and oriented to person, place, and time, no dysarthria or aphasia, Fund of knowledge is appropriate.  Recent and remote memory are intact.  Attention and concentration are normal.    Able to name objects and repeat phrases.  Montreal Cognitive Assessment  03/17/2018  Visuospatial/ Executive (0/5) 4  Naming (0/3) 3  Attention: Read list of digits (0/2) 2  Attention: Read list of letters (0/1) 1  Attention: Serial 7 subtraction starting at 100 (0/3) 3  Language: Repeat phrase (0/2) 2  Language : Fluency (0/1) 0  Abstraction (0/2) 2  Delayed Recall (0/5) 5  Orientation (0/6) 6  Total 28   Cranial nerves: CN I: not tested CN II: pupils equal, round and reactive to light, visual fields intact, fundi unremarkable. CN III, IV, VI:  full range of motion, no nystagmus, no ptosis CN V: reports decreased pin on right V2 and decreased pin on chin CN VII:  upper and lower face symmetric CN VIII: hearing intact to finger rub CN IX, X: gag  intact, uvula midline CN XI: sternocleidomastoid and trapezius muscles intact CN XII: tongue midline Bulk & Tone: normal, no fasciculations, no cogwheeling. Motor: 5/5 throughout with no pronator drift. Sensation: decreased pin on left LE, intact cold. Intact to cold, pin, vibration on both LE.  No extinction to double simultaneous stimulation.  Romberg test negative Deep Tendon Reflexes: +2 throughout, no ankle clonus Plantar responses: downgoing bilaterally Cerebellar: no incoordination on finger to nose testing Gait: narrow-based and steady with good arm swing, able to tandem walk adequately. Tremor: no resting tremor, +bilateral low amplitude high frequency postural and action tremor, R>L Negative pull test, good finger and foot taps  IMPRESSION: This is a 57 year old right-handed woman with a history of hypertension, hyperlipidemia, hypothyroidism, depression, anxiety, presenting for evaluation of seizures. She denies any seizures and states she is here for tremors. Exam consistent with benign essential tremor. She is also reporting cognitive changes, MOCA score normal 28/30. She reports subjective sensory changes. Head CT normal, we will do an MRI brain without contrast to further evaluate her symptoms, rule out underlying structural abnormality. We discussed treatment of benign essential tremor with Propranolol, side effects were discussed, we will start on low dose and uptitrate as tolerate. Start 20mg  qhs x 1 week, then increase to 1 tab BID. She will call us for an update in 2 months and we will plan to increase dose. This may help with anxiety as well, we discussed how benzodiazepines can affect cognition, consider reducing lorazepam dose. We also discussed cognitive changes likely due to underlying psychiatric condition (anxiety/depression), continue follow-up with psychiatry, she was encouraged to do  regular psychotherapy. She will follow-up in 6 months and knows to call for any changes.   Thank you for allowing me to participate in the care of this patient. Please do not hesitate to call for any questions or concerns.   Ellouise Newer, M.D.  CC: Dr. Ernie Hew

## 2018-03-17 NOTE — Patient Instructions (Addendum)
1. Schedule MRI brain without contrast  We have sent a referral to [facility] for your MRI and they will call you directly to schedule your appt. They are located at [place]. If you need to contact them directly please call [phone].   2. Start Propranolol 20mg : Take 1 tablet at night for 1 week, then increase to 1 tablet twice a day. We can further increase dose as tolerated. Call our office in 2 months for an update 3. Continue working with psychiatrist and therapist as anxiety/depression/stress can worsen memory. Consider slowly reducing lorazepam (Ativan) as this can also affect our memory 4. Follow-up in 6 months, call for any changes

## 2018-04-06 ENCOUNTER — Telehealth: Payer: Self-pay | Admitting: Neurology

## 2018-04-06 NOTE — Telephone Encounter (Signed)
Patient called needing to speak with you regarding having her MRI set up at [facility] Fax # [phone]. She said to call Patient? Thanks

## 2018-04-07 NOTE — Telephone Encounter (Signed)
Returned call.  No answer.  LMOM asking for return call.  

## 2018-04-08 ENCOUNTER — Other Ambulatory Visit: Payer: Self-pay

## 2018-04-08 DIAGNOSIS — G250 Essential tremor: Secondary | ICD-10-CM

## 2018-04-08 DIAGNOSIS — R4189 Other symptoms and signs involving cognitive functions and awareness: Secondary | ICD-10-CM

## 2018-04-08 NOTE — Telephone Encounter (Signed)
Patient states [facility] has not received MRI orders. Pt is asking for this to be sent to fax# [phone]. Pt is also asking for a call back when it has been sent.

## 2018-04-08 NOTE — Telephone Encounter (Signed)
Orders placed and faxed.  Will inform pt once I receive auth from [facility].

## 2018-04-11 ENCOUNTER — Other Ambulatory Visit: Payer: Self-pay

## 2018-04-18 ENCOUNTER — Telehealth: Payer: Self-pay | Admitting: Neurology

## 2018-04-18 MED ORDER — PROPRANOLOL HCL 20 MG PO TABS: ORAL_TABLET | ORAL | 11 refills | Status: DC

## 2018-04-18 NOTE — Telephone Encounter (Signed)
Spoke with pt relaying message below.   

## 2018-04-18 NOTE — Telephone Encounter (Signed)
Patient wants to know the results of the MRI done last week.   She also wants to talk to someone about her tremors. She is having a lot pain and the tremors are worst  seh thought maybe a increase in her medication would help.  (she was not sure of the name of the medication she was taking )

## 2018-04-18 NOTE — Telephone Encounter (Signed)
Do we have her MRI results? Can you pls request for report. You can let her know to increase the Propranolol 20mg : Take 1 tab in AM, 2 tabs in PM for 1 week, then increase to 2 tab BID. Pls send in new Rx for higher dose, thanks

## 2018-04-20 ENCOUNTER — Telehealth: Payer: Self-pay | Admitting: Neurology

## 2018-04-20 NOTE — Telephone Encounter (Signed)
Patient calling to get her results from her MRI. Thanks

## 2018-04-20 NOTE — Telephone Encounter (Signed)
I believe they came in yesterday

## 2018-04-22 ENCOUNTER — Telehealth: Payer: Self-pay | Admitting: *Deleted

## 2018-04-22 NOTE — Telephone Encounter (Signed)
Not yet.  I have sent a second request to [facility].

## 2018-04-22 NOTE — Telephone Encounter (Signed)
Any response from Radiology about results?

## 2018-04-22 NOTE — Telephone Encounter (Signed)
Patient called requesting her results. Please advise [phone]

## 2018-04-25 ENCOUNTER — Telehealth: Payer: Self-pay | Admitting: Neurology

## 2018-04-25 NOTE — Telephone Encounter (Signed)
LMOM relaying message below.  

## 2018-04-25 NOTE — Telephone Encounter (Signed)
Pt made aware of MRI results °

## 2018-04-25 NOTE — Telephone Encounter (Signed)
Patient called back , she had missed a call. Please call back  Cell number is [phone] and work number is [phone] ext [phone].

## 2018-04-25 NOTE — Telephone Encounter (Signed)
Pls let patient know we have finally received results of MRI, it is normal, no evidence of tumor, stroke, or bleed. Thanks

## 2018-04-25 NOTE — Telephone Encounter (Signed)
Patient left message on the VM stating she is returning a call about the MRI results  You may call her at [phone] or [phone] ext [phone]

## 2018-04-25 NOTE — Telephone Encounter (Signed)
Returned call to pt relaying MRI results.

## 2018-04-26 ENCOUNTER — Ambulatory Visit: Payer: Self-pay | Admitting: Neurology

## 2018-05-23 ENCOUNTER — Telehealth: Payer: Self-pay | Admitting: Neurology

## 2018-05-23 ENCOUNTER — Other Ambulatory Visit: Payer: Self-pay

## 2018-05-23 MED ORDER — PROPRANOLOL HCL 20 MG PO TABS: ORAL_TABLET | ORAL | 3 refills | Status: DC

## 2018-05-23 NOTE — Telephone Encounter (Signed)
Patient lost her job last week and her ins is running out on the Wednesday at midnight.   She would like to know if we can call her in the higher dosage of the propranolol. She take two in the morning and two in the afternoon. She would like a 6 month supply of the medication called into the [facility] in [place]

## 2018-05-24 NOTE — Telephone Encounter (Signed)
Rx was sent to pharmacy.  90 day supply as pharmacies do not dispense more than 90 days at a time

## 2018-06-02 ENCOUNTER — Encounter: Payer: Self-pay | Admitting: Internal Medicine

## 2018-06-24 ENCOUNTER — Encounter

## 2018-10-25 ENCOUNTER — Ambulatory Visit: Payer: Self-pay | Admitting: Neurology

## 2020-02-13 ENCOUNTER — Encounter: Payer: Self-pay | Admitting: Family Medicine

## 2021-03-20 ENCOUNTER — Ambulatory Visit (INDEPENDENT_AMBULATORY_CARE_PROVIDER_SITE_OTHER): Payer: PRIVATE HEALTH INSURANCE | Admitting: Orthopaedic Surgery

## 2021-03-20 ENCOUNTER — Encounter: Payer: Self-pay | Admitting: Orthopaedic Surgery

## 2021-03-20 ENCOUNTER — Other Ambulatory Visit: Payer: Self-pay

## 2021-03-20 VITALS — BP 134/91 | HR 77 | Ht 65.0 in | Wt 170.2 lb

## 2021-03-20 DIAGNOSIS — F17210 Nicotine dependence, cigarettes, uncomplicated: Secondary | ICD-10-CM

## 2021-03-20 DIAGNOSIS — M778 Other enthesopathies, not elsewhere classified: Secondary | ICD-10-CM | POA: Diagnosis not present

## 2021-03-20 NOTE — Progress Notes (Signed)
Subjective:    Patient ID: Tara Cameron, female    DOB: August 13, 1961, 60 y.o.   MRN: [id number]  HPI She had carpal tunnel surgery on the left 25 years ago or so. She went on vacation two months ago and did a lot of lifting of suitcases and other objects.  She developed a bruise of the volar wrist and swelling. Since then she has had tenderness of volar wrist and some of dorsal wrist.  It is better but has not gone away completely.  She is taking Celebrex for the last month.  She uses a wrist brace.  She types most of her work shift every day.  She has no new trauma. She has no numbness, no redness and the swelling has gone.  She has no other joint problems.  Denies gout or rheumatoid in the family.   Review of Systems  Constitutional: Positive for activity change.  Musculoskeletal: Positive for arthralgias and myalgias.  Allergic/Immunologic: Positive for environmental allergies.  All other systems reviewed and are negative.  For Review of Systems, all other systems reviewed and are negative.  The following is a summary of the past history medically, past history surgically, known current medicines, social history and family history.  This information is gathered electronically by the computer from prior information and documentation.  I review this each visit and have found including this information at this point in the chart is beneficial and informative.   Past Medical History:  Diagnosis Date  . Abnormal CXR    04/2012 - 1mm spiculated RML pulmonary nodule found on CXR, f/u chest CT noted correspondence with benign calcified granuloma, recommendations for f/u CT in one year  . Allergy   . Anxiety   . Arrhythmia   . Cardiac murmur   . Chest pain    normal myoview 04/2012  . Depression   . GERD (gastroesophageal reflux disease)   . Hyperlipidemia   . Hypertension   . Panic attack   . Thyroid disease   . Tobacco abuse     Past Surgical History:  Procedure Laterality  Date  . ABDOMINAL HYSTERECTOMY  1995  . [place]  . HYSTEROSCOPY  1994  . TONSILLECTOMY  1969    Current Outpatient Medications on File Prior to Visit  Medication Sig Dispense Refill  . celecoxib (CELEBREX) 200 MG capsule Take 200 mg by mouth daily.    Marland Kitchen estradiol (ESTRACE) 0.1 MG/GM vaginal cream Place 1 Applicatorful vaginally as directed.    Marland Kitchen levothyroxine (SYNTHROID) 100 MCG tablet Take 100 mcg by mouth daily before breakfast.    . omeprazole (PRILOSEC) 40 MG capsule   2  . rosuvastatin (CRESTOR) 20 MG tablet Take 20 mg by mouth daily.     No current facility-administered medications on file prior to visit.    Social History   Socioeconomic History  . Marital status: Married    Spouse name: Not on file  . Number of children: Not on file  . Years of education: Not on file  . Highest education level: Not on file  Occupational History  . Not on file  Tobacco Use  . Smoking status: Current Some Day Smoker    Packs/day: 0.50    Years: 5.00    Pack years: 2.50    Types: Cigarettes    Last attempt to quit: 05/04/2012    Years since quitting: 8.8  . Smokeless tobacco: Never Used  Substance and Sexual  Activity  . Alcohol use: Yes    Comment: social  . Drug use: No  . Sexual activity: Never  Other Topics Concern  . Not on file  Social History Narrative   Lives in [place].    Social Determinants of Health   Financial Resource Strain: Not on file  Food Insecurity: Not on file  Transportation Needs: Not on file  Physical Activity: Not on file  Stress: Not on file  Social Connections: Not on file  Intimate Partner Violence: Not on file    Family History  Problem Relation Age of Onset  . Cancer Mother        breast  . Cancer Father        lung  . Heart attack Maternal Grandfather 55  . Colon cancer Neg Hx   . Rectal cancer Neg Hx   . Stomach cancer Neg Hx   . Pancreatic cancer Neg Hx     BP (!) 134/91   Pulse 77   Ht 5\' 5"   (1.651 m)   Wt 170 lb 3.2 oz (77.2 kg)   BMI 28.32 kg/m   Body mass index is 28.32 kg/m.      Objective:   Physical Exam Vitals reviewed. Exam conducted with a chaperone present.  Constitutional:      Appearance: She is well-developed.  HENT:     Head: Normocephalic and atraumatic.  Eyes:     Conjunctiva/sclera: Conjunctivae normal.     Pupils: Pupils are equal, round, and reactive to light.  Cardiovascular:     Rate and Rhythm: Normal rate and regular rhythm.  Pulmonary:     Effort: Pulmonary effort is normal.  Abdominal:     Palpations: Abdomen is soft.  Musculoskeletal:       Hands:     Cervical back: Normal range of motion and neck supple.  Skin:    General: Skin is warm and dry.  Neurological:     Mental Status: She is alert and oriented to person, place, and time.     Cranial Nerves: No cranial nerve deficit.     Motor: No abnormal muscle tone.     Coordination: Coordination normal.     Deep Tendon Reflexes: Reflexes are normal and symmetric. Reflexes normal.  Psychiatric:        Behavior: Behavior normal.        Thought Content: Thought content normal.        Judgment: Judgment normal.           Assessment & Plan:   Encounter Diagnoses  Name Primary?  . Left wrist tendinitis Yes  . Nicotine dependence, cigarettes, uncomplicated    I have told her to continue the Celebrex and splint.  She will need to take a picture of her sitting at her desk at work and check ergonomics of her wrist and arms.  I have shown her our pull out computer keyboard adaptor for the staff to use when sitting.  She will look into this.  I have told her to use rubs such as Aspercreme, BioFreeze or Voltaren Gel to the wrist area tid to qid.  Return in three weeks.  Call if any problem.  Precautions discussed.   Electronically Signed Sanjuana Kava, MD 5/26/20228:55 AM

## 2021-04-10 ENCOUNTER — Ambulatory Visit: Payer: PRIVATE HEALTH INSURANCE | Admitting: Orthopaedic Surgery

## 2021-12-01 ENCOUNTER — Other Ambulatory Visit (HOSPITAL_COMMUNITY): Payer: Self-pay | Admitting: Sports Medicine

## 2021-12-01 ENCOUNTER — Other Ambulatory Visit: Payer: Self-pay | Admitting: Sports Medicine

## 2021-12-01 DIAGNOSIS — M5416 Radiculopathy, lumbar region: Secondary | ICD-10-CM

## 2021-12-09 ENCOUNTER — Ambulatory Visit (HOSPITAL_COMMUNITY): Payer: Self-pay

## 2021-12-09 ENCOUNTER — Encounter (HOSPITAL_COMMUNITY): Payer: Self-pay

## 2022-12-24 ENCOUNTER — Encounter: Payer: Self-pay | Admitting: Radiology
